# Patient Record
Sex: Female | Born: 1935 | Race: Black or African American | Hispanic: No | Smoking: Former smoker
Health system: Southern US, Community
[De-identification: ages and names within clinical notes are randomized; demographics above are authoritative.]

## PROBLEM LIST (undated history)

## (undated) DIAGNOSIS — F0390 Unspecified dementia without behavioral disturbance: Secondary | ICD-10-CM

## (undated) DIAGNOSIS — K219 Gastro-esophageal reflux disease without esophagitis: Secondary | ICD-10-CM

## (undated) DIAGNOSIS — I517 Cardiomegaly: Secondary | ICD-10-CM

## (undated) DIAGNOSIS — I10 Essential (primary) hypertension: Secondary | ICD-10-CM

## (undated) HISTORY — DX: Cardiomegaly: I51.7

## (undated) HISTORY — DX: Unspecified dementia, unspecified severity, without behavioral disturbance, psychotic disturbance, mood disturbance, and anxiety: F03.90

## (undated) HISTORY — DX: Essential (primary) hypertension: I10

## (undated) HISTORY — DX: Gastro-esophageal reflux disease without esophagitis: K21.9

---

## 1997-11-08 ENCOUNTER — Ambulatory Visit (HOSPITAL_COMMUNITY): Admission: RE | Admit: 1997-11-08 | Discharge: 1997-11-08 | Payer: Self-pay | Admitting: Internal Medicine

## 1997-11-27 ENCOUNTER — Other Ambulatory Visit: Admission: RE | Admit: 1997-11-27 | Discharge: 1997-11-27 | Payer: Self-pay | Admitting: Internal Medicine

## 1998-03-11 ENCOUNTER — Encounter: Payer: Self-pay | Admitting: Emergency Medicine

## 1998-03-11 ENCOUNTER — Emergency Department (HOSPITAL_COMMUNITY): Admission: EM | Admit: 1998-03-11 | Discharge: 1998-03-11 | Payer: Self-pay | Admitting: Emergency Medicine

## 1998-03-22 ENCOUNTER — Encounter: Payer: Self-pay | Admitting: Emergency Medicine

## 1998-03-22 ENCOUNTER — Emergency Department (HOSPITAL_COMMUNITY): Admission: EM | Admit: 1998-03-22 | Discharge: 1998-03-22 | Payer: Self-pay | Admitting: Emergency Medicine

## 1998-05-02 ENCOUNTER — Ambulatory Visit (HOSPITAL_COMMUNITY): Admission: RE | Admit: 1998-05-02 | Discharge: 1998-05-02 | Payer: Self-pay | Admitting: Obstetrics & Gynecology

## 1998-05-24 ENCOUNTER — Encounter: Payer: Self-pay | Admitting: Emergency Medicine

## 1998-05-24 ENCOUNTER — Encounter: Payer: Self-pay | Admitting: Interventional Cardiology

## 1998-05-24 ENCOUNTER — Inpatient Hospital Stay (HOSPITAL_COMMUNITY): Admission: EM | Admit: 1998-05-24 | Discharge: 1998-05-24 | Payer: Self-pay | Admitting: Ophthalmology

## 1998-11-11 ENCOUNTER — Ambulatory Visit (HOSPITAL_COMMUNITY): Admission: RE | Admit: 1998-11-11 | Discharge: 1998-11-11 | Payer: Self-pay | Admitting: Internal Medicine

## 1998-12-24 ENCOUNTER — Other Ambulatory Visit: Admission: RE | Admit: 1998-12-24 | Discharge: 1998-12-24 | Payer: Self-pay | Admitting: Obstetrics and Gynecology

## 1999-04-09 ENCOUNTER — Emergency Department (HOSPITAL_COMMUNITY): Admission: EM | Admit: 1999-04-09 | Discharge: 1999-04-09 | Payer: Self-pay | Admitting: Podiatry

## 1999-04-09 ENCOUNTER — Encounter: Payer: Self-pay | Admitting: *Deleted

## 1999-07-26 ENCOUNTER — Emergency Department (HOSPITAL_COMMUNITY): Admission: EM | Admit: 1999-07-26 | Discharge: 1999-07-26 | Payer: Self-pay | Admitting: Emergency Medicine

## 1999-10-17 ENCOUNTER — Inpatient Hospital Stay (HOSPITAL_COMMUNITY): Admission: EM | Admit: 1999-10-17 | Discharge: 1999-10-17 | Payer: Self-pay | Admitting: Emergency Medicine

## 1999-10-17 ENCOUNTER — Encounter: Payer: Self-pay | Admitting: Emergency Medicine

## 1999-11-12 ENCOUNTER — Ambulatory Visit (HOSPITAL_COMMUNITY): Admission: RE | Admit: 1999-11-12 | Discharge: 1999-11-12 | Payer: Self-pay | Admitting: Interventional Cardiology

## 1999-11-14 ENCOUNTER — Encounter: Payer: Self-pay | Admitting: Internal Medicine

## 1999-11-14 ENCOUNTER — Ambulatory Visit (HOSPITAL_COMMUNITY): Admission: RE | Admit: 1999-11-14 | Discharge: 1999-11-14 | Payer: Self-pay | Admitting: Internal Medicine

## 2000-05-25 ENCOUNTER — Ambulatory Visit (HOSPITAL_COMMUNITY): Admission: RE | Admit: 2000-05-25 | Discharge: 2000-05-25 | Payer: Self-pay | Admitting: Internal Medicine

## 2000-05-25 ENCOUNTER — Encounter: Payer: Self-pay | Admitting: Internal Medicine

## 2000-11-15 ENCOUNTER — Ambulatory Visit (HOSPITAL_COMMUNITY): Admission: RE | Admit: 2000-11-15 | Discharge: 2000-11-15 | Payer: Self-pay | Admitting: Internal Medicine

## 2001-08-23 ENCOUNTER — Encounter: Payer: Self-pay | Admitting: Emergency Medicine

## 2001-08-23 ENCOUNTER — Emergency Department (HOSPITAL_COMMUNITY): Admission: EM | Admit: 2001-08-23 | Discharge: 2001-08-23 | Payer: Self-pay | Admitting: Emergency Medicine

## 2001-11-17 ENCOUNTER — Encounter: Payer: Self-pay | Admitting: Internal Medicine

## 2001-11-17 ENCOUNTER — Ambulatory Visit (HOSPITAL_COMMUNITY): Admission: RE | Admit: 2001-11-17 | Discharge: 2001-11-17 | Payer: Self-pay | Admitting: Internal Medicine

## 2002-02-07 ENCOUNTER — Emergency Department (HOSPITAL_COMMUNITY): Admission: EM | Admit: 2002-02-07 | Discharge: 2002-02-07 | Payer: Self-pay | Admitting: Emergency Medicine

## 2002-02-07 ENCOUNTER — Encounter: Payer: Self-pay | Admitting: Emergency Medicine

## 2002-04-28 ENCOUNTER — Emergency Department (HOSPITAL_COMMUNITY): Admission: EM | Admit: 2002-04-28 | Discharge: 2002-04-29 | Payer: Self-pay | Admitting: Emergency Medicine

## 2002-04-29 ENCOUNTER — Encounter: Payer: Self-pay | Admitting: Emergency Medicine

## 2002-11-20 ENCOUNTER — Ambulatory Visit (HOSPITAL_COMMUNITY): Admission: RE | Admit: 2002-11-20 | Discharge: 2002-11-20 | Payer: Self-pay | Admitting: Internal Medicine

## 2002-11-20 ENCOUNTER — Encounter: Payer: Self-pay | Admitting: Internal Medicine

## 2003-07-17 ENCOUNTER — Emergency Department (HOSPITAL_COMMUNITY): Admission: EM | Admit: 2003-07-17 | Discharge: 2003-07-17 | Payer: Self-pay | Admitting: Emergency Medicine

## 2003-11-26 ENCOUNTER — Ambulatory Visit (HOSPITAL_COMMUNITY): Admission: RE | Admit: 2003-11-26 | Discharge: 2003-11-26 | Payer: Self-pay | Admitting: Internal Medicine

## 2004-04-09 ENCOUNTER — Ambulatory Visit: Payer: Self-pay | Admitting: Pulmonary Disease

## 2005-01-13 ENCOUNTER — Ambulatory Visit (HOSPITAL_COMMUNITY): Admission: RE | Admit: 2005-01-13 | Discharge: 2005-01-13 | Payer: Self-pay | Admitting: Internal Medicine

## 2005-08-10 ENCOUNTER — Ambulatory Visit: Payer: Self-pay | Admitting: Pulmonary Disease

## 2006-01-25 ENCOUNTER — Ambulatory Visit (HOSPITAL_COMMUNITY): Admission: RE | Admit: 2006-01-25 | Discharge: 2006-01-25 | Payer: Self-pay | Admitting: Internal Medicine

## 2006-02-03 ENCOUNTER — Encounter: Admission: RE | Admit: 2006-02-03 | Discharge: 2006-02-03 | Payer: Self-pay | Admitting: Internal Medicine

## 2006-03-22 ENCOUNTER — Emergency Department (HOSPITAL_COMMUNITY): Admission: EM | Admit: 2006-03-22 | Discharge: 2006-03-22 | Payer: Self-pay | Admitting: Emergency Medicine

## 2006-06-10 ENCOUNTER — Ambulatory Visit (HOSPITAL_COMMUNITY): Admission: RE | Admit: 2006-06-10 | Discharge: 2006-06-11 | Payer: Self-pay | Admitting: Orthopedic Surgery

## 2006-08-17 ENCOUNTER — Encounter (HOSPITAL_BASED_OUTPATIENT_CLINIC_OR_DEPARTMENT_OTHER): Admission: RE | Admit: 2006-08-17 | Discharge: 2006-08-25 | Payer: Self-pay | Admitting: Surgery

## 2006-09-15 ENCOUNTER — Ambulatory Visit: Payer: Self-pay | Admitting: Pulmonary Disease

## 2007-02-08 ENCOUNTER — Ambulatory Visit (HOSPITAL_COMMUNITY): Admission: RE | Admit: 2007-02-08 | Discharge: 2007-02-08 | Payer: Self-pay | Admitting: Internal Medicine

## 2008-02-09 ENCOUNTER — Ambulatory Visit (HOSPITAL_COMMUNITY): Admission: RE | Admit: 2008-02-09 | Discharge: 2008-02-09 | Payer: Self-pay | Admitting: Internal Medicine

## 2009-02-11 ENCOUNTER — Ambulatory Visit (HOSPITAL_COMMUNITY): Admission: RE | Admit: 2009-02-11 | Discharge: 2009-02-11 | Payer: Self-pay | Admitting: Internal Medicine

## 2010-02-13 ENCOUNTER — Ambulatory Visit (HOSPITAL_COMMUNITY)
Admission: RE | Admit: 2010-02-13 | Discharge: 2010-02-13 | Payer: Self-pay | Source: Home / Self Care | Attending: Internal Medicine | Admitting: Internal Medicine

## 2010-07-08 NOTE — Assessment & Plan Note (Signed)
[facility]   NAME:  Elizabeth Serrano, Elizabeth Serrano                 ACCOUNT NO.:  [id number]   MEDICAL RECORD NO.:  [id number]      DATE OF BIRTH:  Oct 11, 1920   PHYSICIAN:  Theresia Majors. Tanda Rockers, M.D. VISIT DATE:  08/17/2006                                   OFFICE VISIT   SUBJECTIVE:  Elizabeth Serrano is an 77 year old female referred by Dr. Doristine Section for evaluation of bilateral stasis with ulceration.   IMPRESSION:  Stasis with excoriation.  No active ulceration.   RECOMMENDATIONS:  Proceed with management per protocol for chronic  venous insufficiency with stasis.  (I.e. external compression hose.)  Serial exams to avoid complications (i.e. ulceration hyperpigmentation  and pruritus).   SUBJECTIVE:  Elizabeth Serrano is an 77 year old lady who is in good health.  She had a hip fracture several years ago, and in the prodrome complained  of persistent pain necessitating removal of her internal fixating rod.  She has continued to be ambulatory since she experienced significant  relief of her symptoms from the hardware removal.  As her activity has  increased.  She has noted increased swelling and discoloration in the  lower extremities.  She has had no pain.  She is only complaining of  itching.  These symptoms are more predominant in the evening.  She has  recently procured and is wearing external support hose.   PAST MEDICAL HISTORY:  Remarkable for stable health.   PRIMARY CARE PHYSICIAN:  Dr. Margaretmary Bayley.  Dermatologist, Dr. Joseph Art.   CURRENT MEDICATION LIST:  Includes triamcinolone ointment, Aredia, an  aspirin, and Hyzaar.   PREVIOUS SURGERIES:  Includes the cholecystectomy in 1979, open  reduction internal fixation rodding of the right femur in 1994 with  removal of a rod in April 2008.   She is allergic to PENICILLIN.   FAMILY HISTORY:  Positive for diabetes and hypertension and stroke.   SOCIALLY:  She is married.  She lives in [place] with her husband.  She is a  retired Programmer, systems.  She has 1 son who lives remotely.   REVIEW OF SYSTEMS:  Remarkable for extraordinary independence.  The  patient does her own shopping, housework and house domestic tasks.  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  She denies  visual changes, transient paralysis, or speech impediments.  She  specifically denies angina symptomatology.  She has had an episode of  angina in the past but has had no recurrent symptoms.  Her bowel and  bladder function are described as normal.  Her weight is stable.  The  remainder of the review of systems is negative.   PHYSICAL EXAM:  She is a pleasant elderly female in good contact with  reality.  Spontaneous in conversation and responding appropriately to  all of historical inquiries.  The blood pressure is 150/81, respirations 18, pulse rate 77,  temperature 98.1.  The HEENT exam is clear.  Neck is supple.  Trachea is midline.  Thyroid is nonpalpable.  Carotid  bruits are not discerned.  The lungs are clear.  Heart sounds were distant.  Abdomen is soft.  Femoral pulses are 3+.  The dorsalis pedis pulse bilaterally is 2+.  Capillary refill was  normal.  There are chronic  changes of stasis with hyperpigmentation and  there are minor areas of excoriation from scratching on the anterior  shins of the right lower extremity.  The sensation is intact.   ASSESSMENT:  Chronic venous insufficiency with mild to moderate stasis.   DISCUSSION:  This 77 year old lady has quite remarkable and stable  health.  We have explained the pathophysiology of stasis in terms that  she seems to understand.  We have explained the nature and the use of  external compression in the management of the same.  We have given her  an opportunity to ask questions.  She seems to comprehend all of the  aforementioned discussion, expresses gratitude for having been seen in  the clinic.  She will continue her triamcinolone ointment and the use of   external compression hose.  We will be happy to reevaluate her in the  event that she develops an ulcer or a complication of stasis.  These  again have been addressed specifically and discussed with the patient.      Harold A. Tanda Rockers, M.D.  Electronically Signed     HAN/MEDQ  D:  08/17/2006  T:  08/17/2006  Job:  [id number]   cc:   Margaretmary Bayley, M.D.  Karie Soda. Joseph Art, M.D.

## 2010-07-11 NOTE — Op Note (Signed)
NAME:  Elizabeth Serrano, Elizabeth Serrano                 ACCOUNT NO.:  [id number]   MEDICAL RECORD NO.:  [id number]          PATIENT TYPE:  OIB   LOCATION:  5041                         FACILITY:  [facility]   PHYSICIAN:  Lenard Galloway. Mortenson, M.D.DATE OF BIRTH:  December 12, 1920   DATE OF PROCEDURE:  06/10/2006  DATE OF DISCHARGE:                               OPERATIVE REPORT   JUSTIFICATION:  An 77 year old female who has IM nail in the right femur  that has become painful proximally.  Has a large area of myositis  ossificans and tenderness proximally.  She is now admitted for removal  of IM nail.   JUSTIFICATION OUTPATIENT SURGERY:  Minimal morbidity.   PREOPERATIVE DIAGNOSIS:  Routine hardware right femur, painful.   POSTOPERATIVE DIAGNOSIS:  Routine hardware right femur, painful.   OPERATION:  Removal intramedullary nail, right femur.   SURGEON:  Lenard Galloway. Chaney Malling, M.D.   ASSISTANT:  Arlys John D. Petrarca, P.A.-C.   ANESTHESIA:  General.   PROCEDURE:  After the satisfactory general anesthesia, the patient was  placed on the operative table in full lateral position, the right hip  up.  The right leg and hip were then prepped with DuraPrep and draped  out in the usual manner.  C-arm imaging was used throughout.  An  incision was made distally where the previous interlocking screw was  inserted.  This was all done under C-arm control.  Dissection carried  down to the lateral side of the distal femur.  The interlocking screw  was found and backed out.  Attention was then turned to the proximal  femur.  An incision was made above the greater trochanter.  Dissection  was carried down distally.  C-arm was used for localization with the  possible end of the femur.  A large area of myositis ossificans about  the greater trochanter was identified and isolated.  Soft tissue was  stripped off.  An osteotome was used to break up the myositis and then  the IM nail could slightly be seen.  Myositis was cleared out so  there  was excellent access to the proximal nail.  I tapped the instruments  passed down the femoral IM nail proximally and then a slap hammer was  attached.  Using the slap hammer, the IM nail was then backed out of the  femur very easily with no complications.  The wound was irrigated.  The  fascia was closed with Vicryl, subcutaneous tissue closed with Vicryl,  and stainless steel staples used to close the skin.  Sterile dressing  was placed about the hip and distal femur and the patient returned to  recovery room in excellent condition.  Drains:  None.  Complications:  None.           ______________________________  Lenard Galloway. Chaney Malling, M.D.     RAM/MEDQ  D:  06/10/2006  T:  06/10/2006  Job:  [id number]

## 2010-07-11 NOTE — H&P (Signed)
[facility]  Patient:    HOA, BRIGGS                          MRN: [id number] Adm. Date:  [id number] Attending:  Lyn Records. Iii CC:         Darci Needle, M.D.  Lindell Spar. Chestine Spore, M.D.   History and Physical  REASON FOR ADMISSION:  Chest discomfort.  HISTORY OF PRESENT ILLNESS:  The patient is a 77 year old black female with a prior diagnosis of angina and hyperlipidemia.  She has chronic abdominal complaints.  She has seen Dr. Randa Evens before.  She reportedly had a catheterization in previous years according to old chart in 1996. It reportedly did not show significant disease.  Records are not available at that time either.  She was admitted with chest wall pain in 1997. She presented to the emergency room around 12 a.m. this morning with chest discomfort as well as recent lower leg symptoms.  She describes increasing cramping and lower leg pain that she had attributed to Zocor.  Her family doctor, Dr. Margaretmary Bayley, had taken her off of Zocor and switched her to Lipitor but she has now stopped taking that because of what she describes as leg pain.  She then developed mid sternal burning type pain which became sharp and mildly pleuritic.  She presented to the emergency room around midnight and had a borderline CPK and MB.  The symptoms had variable response to nitroglycerin and would wax and wane.  She also would complain of a left sided burning type pain which was different than the mid pain. It was not worsened with food.  She is currently not having any symptoms and was admitted to rule out an myocardial infarction.  PAST MEDICAL HISTORY:  Remarkable for hyperlipidemia and chronic abdominal problems.  Negative for hypertension or diabetes.  PAST SURGICAL HISTORY:  Cholecystectomy and cataract surgery.  ALLERGIES:  Penicillin and Claritin, also several environmental substances.  CURRENT MEDICINES: Cardizem 120 mg daily. Vitamin E.   Baby aspirin.  Tussinbin 1/2 tablet b.i.d.  Multiple eye drops and allergy shots weekly.  FAMILY HISTORY:  Negative for premature cataract disease.  Father died at age 77.  SOCIAL HISTORY:  She is married and lives with her husband. She drinks rare alcohol and has one child.  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  REVIEW OF SYSTEMS:  Some constipation.  She has some abdominal pain.  She has no GU symptoms and no weight loss.  The remainder of the review of systems is unremarkable except as noted above.  PHYSICAL EXAMINATION:  GENERAL: She is a pleasant black female appearing younger than stated age.  VITAL SIGNS:  Blood pressure 130/80, pulse 70.  SKIN: Warm and dry.  HEENT:  Pupils constricted.  No xanthelasma.  Pharynx negative.  NECK:  Supple without masses, thyromegaly or carotid bruits.  LYMPH NODES:  Normal.  LUNGS:  Clear to A&P.  CARDIAC:  Normal S1 and S2.  No S3, S4 or murmur.  ABDOMEN:  Soft and nontender.  No rebound.  No aneurysm noted.  PULSES: Femoral pulses are 2+. Pedal pulses are 2+. There is trace edema.  NEUROLOGIC:  Normal.  12 lead ECG showed anterolateral T wave inversion similar to previous studies.  IMPRESSION: 1. Chest discomfort with some typical or other atypical features, rule out    unstable angina or myocardial  infarction. 2. Hyperlipidemia not currently under treatment. 3. History of diagnosis of angina with reported negative catheterization    previously but not documented in records. 4. Multiple allergies. 5. Glaucoma under treatment.  RECOMMENDATIONS:  Admit and begin IV heparin.  Rule out MI with serial CPKs and EKGs.  Further follow up and workup by Dr. Katrinka Blazing. DD:  10/17/99 TD:  10/17/99 Job: [id number]

## 2011-02-25 ENCOUNTER — Other Ambulatory Visit: Payer: Self-pay | Admitting: Internal Medicine

## 2011-02-25 DIAGNOSIS — Z1231 Encounter for screening mammogram for malignant neoplasm of breast: Secondary | ICD-10-CM

## 2011-03-24 ENCOUNTER — Ambulatory Visit (HOSPITAL_COMMUNITY)
Admission: RE | Admit: 2011-03-24 | Discharge: 2011-03-24 | Disposition: A | Discharge status: Home / Self Care | Payer: Medicare Other | Source: Ambulatory Visit | Attending: Internal Medicine | Admitting: Internal Medicine

## 2011-03-24 ENCOUNTER — Ambulatory Visit (HOSPITAL_COMMUNITY): Payer: Self-pay

## 2011-03-24 DIAGNOSIS — Z1231 Encounter for screening mammogram for malignant neoplasm of breast: Secondary | ICD-10-CM | POA: Insufficient documentation

## 2011-04-21 ENCOUNTER — Ambulatory Visit (HOSPITAL_COMMUNITY): Payer: Self-pay

## 2013-09-13 ENCOUNTER — Encounter: Payer: Self-pay | Admitting: *Deleted

## 2015-02-06 ENCOUNTER — Ambulatory Visit (INDEPENDENT_AMBULATORY_CARE_PROVIDER_SITE_OTHER): Payer: Medicare Other | Admitting: Internal Medicine

## 2015-02-06 ENCOUNTER — Other Ambulatory Visit (INDEPENDENT_AMBULATORY_CARE_PROVIDER_SITE_OTHER): Payer: Medicare Other

## 2015-02-06 ENCOUNTER — Encounter: Payer: Self-pay | Admitting: Internal Medicine

## 2015-02-06 VITALS — BP 126/88 | HR 67 | Temp 98.3°F | Resp 20 | Ht 59.0 in | Wt 113.2 lb

## 2015-02-06 DIAGNOSIS — Z23 Encounter for immunization: Secondary | ICD-10-CM | POA: Diagnosis not present

## 2015-02-06 DIAGNOSIS — K219 Gastro-esophageal reflux disease without esophagitis: Secondary | ICD-10-CM | POA: Diagnosis not present

## 2015-02-06 DIAGNOSIS — I10 Essential (primary) hypertension: Secondary | ICD-10-CM | POA: Diagnosis not present

## 2015-02-06 DIAGNOSIS — R413 Other amnesia: Secondary | ICD-10-CM | POA: Diagnosis not present

## 2015-02-06 LAB — CBC
HEMATOCRIT: 36.2 % (ref 36.0–46.0)
Hemoglobin: 11.7 g/dL — ABNORMAL LOW (ref 12.0–15.0)
MCHC: 32.3 g/dL (ref 30.0–36.0)
MCV: 85.5 fl (ref 78.0–100.0)
Platelets: 234 10*3/uL (ref 150.0–400.0)
RBC: 4.24 Mil/uL (ref 3.87–5.11)
RDW: 14.3 % (ref 11.5–15.5)
WBC: 5.7 10*3/uL (ref 4.0–10.5)

## 2015-02-06 LAB — COMPREHENSIVE METABOLIC PANEL
ALBUMIN: 3.7 g/dL (ref 3.5–5.2)
ALT: 10 U/L (ref 0–35)
AST: 20 U/L (ref 0–37)
Alkaline Phosphatase: 73 U/L (ref 39–117)
BUN: 15 mg/dL (ref 6–23)
CALCIUM: 9.1 mg/dL (ref 8.4–10.5)
CHLORIDE: 100 meq/L (ref 96–112)
CO2: 28 mEq/L (ref 19–32)
Creatinine, Ser: 1.18 mg/dL (ref 0.40–1.20)
GFR: 55.01 mL/min — AB (ref >60.00)
Glucose, Bld: 93 mg/dL (ref 70–99)
POTASSIUM: 4.3 meq/L (ref 3.5–5.1)
SODIUM: 135 meq/L (ref 135–145)
Total Bilirubin: 0.4 mg/dL (ref 0.2–1.2)
Total Protein: 6.7 g/dL (ref 6.0–8.3)

## 2015-02-06 LAB — LIPID PANEL
CHOLESTEROL: 219 mg/dL — AB (ref 0–200)
HDL: 64.2 mg/dL (ref >39.00)
LDL CALC: 138 mg/dL — AB (ref 0–99)
NonHDL: 154.42
TRIGLYCERIDES: 84 mg/dL (ref 0.0–149.0)
Total CHOL/HDL Ratio: 3
VLDL: 16.8 mg/dL (ref 0.0–40.0)

## 2015-02-06 MED ORDER — DONEPEZIL HCL 10 MG PO TABS: 10.0000 mg | ORAL_TABLET | Freq: Every day | ORAL | Status: DC

## 2015-02-06 MED ORDER — OLMESARTAN MEDOXOMIL-HCTZ 20-12.5 MG PO TABS: 1.0000 | ORAL_TABLET | Freq: Every day | ORAL | Status: DC

## 2015-02-06 NOTE — Patient Instructions (Signed)
We will check your labs today and call you back about the results.   We have sent in the refills of the benicar. We have also sent in the memory medicine that is proven to work and help keep the memory good.   Memory Compensation Strategies  1. Use "WARM" strategy.  W= write it down  A= associate it  R= repeat it  M= make a mental note  2.   You can keep a Glass blower/designerMemory Notebook.  Use a 3-ring notebook with sections for the following: calendar, important names and phone numbers,  medications, doctors' names/phone numbers, lists/reminders, and a section to journal what you did  each day.   3.    Use a calendar to write appointments down.  4.    Write yourself a schedule for the day.  This can be placed on the calendar or in a separate section of the Memory Notebook.  Keeping a  regular schedule can help memory.  5.    Use medication organizer with sections for each day or morning/evening pills.  You may need help loading it  6.    Keep a basket, or pegboard by the door.  Place items that you need to take out with you in the basket or on the pegboard.  You may also want to  include a message board for reminders.  7.    Use sticky notes.  Place sticky notes with reminders in a place where the task is performed.  For example: " turn off the  stove" placed by the stove, "lock the door" placed on the door at eye level, " take your medications" on  the bathroom mirror or by the place where you normally take your medications.  8.    Use alarms/timers.  Use while cooking to remind yourself to check on food or as a reminder to take your medicine, or as a  reminder to make a call, or as a reminder to perform another task, etc.

## 2015-02-06 NOTE — Progress Notes (Signed)
Pre visit review using our clinic review tool, if applicable. No additional management support is needed unless otherwise documented below in the visit note. 

## 2015-02-07 DIAGNOSIS — K219 Gastro-esophageal reflux disease without esophagitis: Secondary | ICD-10-CM | POA: Insufficient documentation

## 2015-02-07 DIAGNOSIS — I10 Essential (primary) hypertension: Secondary | ICD-10-CM | POA: Insufficient documentation

## 2015-02-07 DIAGNOSIS — F0281 Dementia in other diseases classified elsewhere with behavioral disturbance: Secondary | ICD-10-CM | POA: Insufficient documentation

## 2015-02-07 DIAGNOSIS — F22 Delusional disorders: Secondary | ICD-10-CM

## 2015-02-07 DIAGNOSIS — G301 Alzheimer's disease with late onset: Secondary | ICD-10-CM
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
## 2015-02-07 NOTE — Assessment & Plan Note (Signed)
Taking prevacid with good relief. Continue for now.

## 2015-02-07 NOTE — Assessment & Plan Note (Signed)
BP controlled on benicar/hctz and will continue. Checking CMP today and adjust as needed.

## 2015-02-07 NOTE — Assessment & Plan Note (Signed)
Some impairment on exam today. She did not want to do formal assessment today with MMSE as she does not feel that it is a problem. Advised that she drive only within 5 miles of home. No traffic violations or accidents. Rx for aricept given today for her to take. She was likely on exelon patches in the past which did not do well since she did not know she should continue and did not remember. She does take her own medicines and will take this for her memory. She is also taking prevagen OTC and I advised her that this may not have any efficacy (no good studies).

## 2015-04-22 ENCOUNTER — Ambulatory Visit (INDEPENDENT_AMBULATORY_CARE_PROVIDER_SITE_OTHER): Payer: Medicare Other | Admitting: Internal Medicine

## 2015-04-22 ENCOUNTER — Encounter: Payer: Self-pay | Admitting: Internal Medicine

## 2015-04-22 VITALS — BP 102/70 | HR 81 | Temp 98.1°F | Resp 16 | Ht 59.0 in | Wt 109.0 lb

## 2015-04-22 DIAGNOSIS — R413 Other amnesia: Secondary | ICD-10-CM | POA: Diagnosis not present

## 2015-04-22 MED ORDER — DONEPEZIL HCL 10 MG PO TABS: 10.0000 mg | ORAL_TABLET | Freq: Every day | ORAL | Status: DC

## 2015-04-22 NOTE — Patient Instructions (Signed)
We have given you the letter for not driving.   We are happy to fill out any forms for her new living situation and it is my opinion that for her safety she needs to be moved as soon as possible to a safe living situation.   Please call us if we can do anything to help.

## 2015-04-22 NOTE — Progress Notes (Signed)
Pre visit review using our clinic review tool, if applicable. No additional management support is needed unless otherwise documented below in the visit note. 

## 2015-04-24 ENCOUNTER — Telehealth: Payer: Self-pay | Admitting: *Deleted

## 2015-04-24 NOTE — Telephone Encounter (Signed)
Received call from pt daughter Hart Carwin) stating that they spoke with md about mom crying spells & hallucinations at night they would like md to go ahead to rx something that she can take to help her calm down & get rest at night...Raechel Chute
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
## 2015-04-25 NOTE — Telephone Encounter (Signed)
Pls advise on msg below.../lmb 

## 2015-04-25 NOTE — Assessment & Plan Note (Signed)
She has not tried aricept. Given her my recommendation that she should not drive for her safety and the safety of others. She is in agreement of that. Also convinced her that her safety is in question in her current living situation and will fill out FL-2 when they decide on living situation.

## 2015-04-26 MED ORDER — QUETIAPINE FUMARATE 100 MG PO TABS: 100.0000 mg | ORAL_TABLET | Freq: Every evening | ORAL | Status: DC | PRN

## 2015-04-26 NOTE — Telephone Encounter (Signed)
Sent in seroquel that can be used at bedtime prn.

## 2015-04-26 NOTE — Telephone Encounter (Signed)
Received call from pt daughter Hart Carwin([place]) requesting status on msg she left Wed. Pls advise...Raechel Chute/lmb

## 2015-04-26 NOTE — Telephone Encounter (Signed)
Notified pt/daughter w/MD response../lmb 

## 2015-05-03 ENCOUNTER — Telehealth: Payer: Self-pay | Admitting: Internal Medicine

## 2015-05-03 DIAGNOSIS — R300 Dysuria: Secondary | ICD-10-CM
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
## 2015-05-03 NOTE — Telephone Encounter (Signed)
This would have to be dealt with at a visit since we did not discuss at all at the recent visit. If the problem is escalating it would be a good idea to take her to the ER so they can make sure she does not have any infections which could cause the worsening.

## 2015-05-06 ENCOUNTER — Other Ambulatory Visit (INDEPENDENT_AMBULATORY_CARE_PROVIDER_SITE_OTHER): Payer: Medicare Other

## 2015-05-06 DIAGNOSIS — R300 Dysuria: Secondary | ICD-10-CM | POA: Diagnosis not present

## 2015-05-06 LAB — URINALYSIS, ROUTINE W REFLEX MICROSCOPIC
BILIRUBIN URINE: NEGATIVE
Hgb urine dipstick: NEGATIVE
Ketones, ur: NEGATIVE
LEUKOCYTES UA: NEGATIVE
Nitrite: NEGATIVE
PH: 6 (ref 5.0–8.0)
RBC / HPF: NONE SEEN (ref 0–?)
SPECIFIC GRAVITY, URINE: 1.015 (ref 1.000–1.030)
Total Protein, Urine: NEGATIVE
Urine Glucose: NEGATIVE
Urobilinogen, UA: 0.2 (ref 0.0–1.0)
WBC, UA: NONE SEEN (ref 0–?)

## 2015-05-06 NOTE — Telephone Encounter (Signed)
Called left vm with ivy

## 2015-05-06 NOTE — Telephone Encounter (Signed)
Patient will come to the lab and leave a urine sample and she will also drop off the FL2 forms.

## 2015-05-06 NOTE — Addendum Note (Signed)
Addended by: Hillard DankerRAWFORD, ELIZABETH A on: 05/06/2015 11:23 AM   Modules accepted: Orders

## 2015-05-06 NOTE — Telephone Encounter (Signed)
See encounter below.  

## 2015-05-06 NOTE — Telephone Encounter (Signed)
Pt has called back and they are also needing an appointment to have a FL2 and DMV form filled out along with treating a bladder infection asap and there are no openings this week. Would you be willing to see her before you leave Wednesday morning?

## 2015-05-06 NOTE — Telephone Encounter (Signed)
Urine placed for lab they can come do. They do not need visit for forms as we talked about those at last visit. They can just drop off forms and will be done ASAP.

## 2015-05-08 ENCOUNTER — Ambulatory Visit (INDEPENDENT_AMBULATORY_CARE_PROVIDER_SITE_OTHER): Payer: Medicare Other | Admitting: Nurse Practitioner

## 2015-05-08 ENCOUNTER — Encounter: Payer: Self-pay | Admitting: Nurse Practitioner

## 2015-05-08 ENCOUNTER — Telehealth: Payer: Self-pay

## 2015-05-08 VITALS — BP 116/60 | HR 70 | Temp 97.6°F | Ht 59.0 in | Wt 110.5 lb

## 2015-05-08 DIAGNOSIS — R32 Unspecified urinary incontinence: Secondary | ICD-10-CM

## 2015-05-08 DIAGNOSIS — R2681 Unsteadiness on feet: Secondary | ICD-10-CM

## 2015-05-08 NOTE — Telephone Encounter (Signed)
Pt was seen today by Naomie Deanarrie Doss. Pt son states that the Aricept has helped a lot with her memory but she is still having some aggression starting after about 1-2 in the afternoon.Is there an adjustment to the seroquel or would an OV be needed to further evaluate.   Also, pt son is requesting an rx for a new walking cane. The cane that she is using is bent and not very sturdy.

## 2015-05-08 NOTE — Progress Notes (Signed)
Patient ID: Elizabeth BobLois C Neth, female    DOB: 06-22-1920  Age: 82 y.o. MRN: [id number]  CC: Possible Blood loss   HPI Elizabeth Serrano presents for CC of blood in the toilet from unknown location. Daughter saw the blood.   1) Pt had UA on 05/06/15 without any findings of hematuria or infection.  1 episode in the toilet- not frank blood Dribbling urine constantly she reports  Obvious discoloration down front and sides of pant legs    History Elizabeth Serrano has a past medical history of Hypertension; LVH (left ventricular hypertrophy); GERD (gastroesophageal reflux disease); and Dementia.   She has no past surgical history on file.   Her family history is not on file.She reports that she quit smoking about 38 years ago. She does not have any smokeless tobacco history on file. Her alcohol and drug histories are not on file.  Outpatient Prescriptions Prior to Visit  Medication Sig Dispense Refill  . aspirin 81 MG tablet Take 81 mg by mouth daily.    . bimatoprost (LUMIGAN) 0.01 % SOLN Place 1 drop into both eyes at bedtime.    . brimonidine (ALPHAGAN P) 0.1 % SOLN     . donepezil (ARICEPT) 10 MG tablet Take 1 tablet (10 mg total) by mouth at bedtime. 90 tablet 3  . dorzolamide-timolol (COSOPT) 22.3-6.8 MG/ML ophthalmic solution 1 drop 2 (two) times daily.    . lansoprazole (PREVACID) 15 MG capsule Take 15 mg by mouth as needed.    Marland Kitchen. olmesartan-hydrochlorothiazide (BENICAR HCT) 20-12.5 MG tablet Take 1 tablet by mouth daily. 90 tablet 3  . QUEtiapine (SEROQUEL) 100 MG tablet Take 1 tablet (100 mg total) by mouth at bedtime as needed. 30 tablet 0  . vitamin E 400 UNIT capsule Take 400 Units by mouth daily.     No facility-administered medications prior to visit.    ROS Review of Systems  Constitutional: Negative for fever, chills, diaphoresis and fatigue.  Genitourinary: Positive for vaginal bleeding. Negative for dysuria, urgency, frequency, hematuria, flank pain, decreased urine volume, difficulty  urinating and pelvic pain.  Skin: Negative for rash.   Objective:  BP 116/60 mmHg  Pulse 70  Temp(Src) 97.6 F (36.4 C) (Oral)  Ht 4\' 11"  (1.499 m)  Wt 110 lb 8 oz (50.122 kg)  BMI 22.31 kg/m2  SpO2 97%  Physical Exam  Constitutional: She appears well-developed and well-nourished. No distress.  HENT:  Head: Normocephalic and atraumatic.  Right Ear: External ear normal.  Left Ear: External ear normal.  Genitourinary: Vagina normal. No vaginal discharge found.  No external or internal signs of bleeding  Rectal area shows no signs of bleeding either- external hemorrhoid non-thrombosed, non-tender  Neurological: She is alert.  Skin: Skin is warm and dry. No rash noted. She is not diaphoretic.   Assessment & Plan:   Elizabeth Serrano was seen today for possible blood loss.  Diagnoses and all orders for this visit:  Urinary incontinence in female -     Ambulatory referral to Urology   I am having Ms. Degroote maintain her lansoprazole, vitamin E, aspirin, dorzolamide-timolol, bimatoprost, brimonidine, olmesartan-hydrochlorothiazide, donepezil, and QUEtiapine.  No orders of the defined types were placed in this encounter.     Follow-up: Return if symptoms worsen or fail to improve.

## 2015-05-08 NOTE — Progress Notes (Signed)
Pre visit review using our clinic review tool, if applicable. No additional management support is needed unless otherwise documented below in the visit note. 

## 2015-05-08 NOTE — Patient Instructions (Signed)
We will get you set up with urology for urinary incontinence.   Everything else looks good!

## 2015-05-09 NOTE — Telephone Encounter (Signed)
Order entered for quadcane per PCP. [facility] for advise regarding any medicare guidelines.

## 2015-05-09 NOTE — Telephone Encounter (Signed)
Left msg on son's vm to call back to schedule OV.

## 2015-05-09 NOTE — Telephone Encounter (Signed)
Did they address this with Lyla SonCarrie? This is really something that needs a visit as we did not talk about at our visit. Do they want just a normal cane or quadcane?

## 2015-05-09 NOTE — Telephone Encounter (Signed)
She currently has a quadcane.   I will get the son to schedule an OV to discuss the med adjustment/change.

## 2015-05-12 DIAGNOSIS — R32 Unspecified urinary incontinence: Secondary | ICD-10-CM | POA: Insufficient documentation

## 2015-05-12 NOTE — Assessment & Plan Note (Addendum)
Obvious dribbling of urine upon observation and pant legs are discolored. Pt is resistant to wearing depends daily. PCP will be placing referral. No obvious signs of bleeding internally/externally from the vuvla/vaginal vault nor from the anus/rectum.

## 2015-05-22 ENCOUNTER — Other Ambulatory Visit: Payer: Self-pay | Admitting: Internal Medicine

## 2015-05-22 ENCOUNTER — Ambulatory Visit (INDEPENDENT_AMBULATORY_CARE_PROVIDER_SITE_OTHER): Payer: Medicare Other | Admitting: Internal Medicine

## 2015-05-22 ENCOUNTER — Encounter: Payer: Self-pay | Admitting: Internal Medicine

## 2015-05-22 VITALS — BP 118/60 | HR 60 | Temp 98.1°F | Resp 12 | Ht 59.0 in | Wt 112.0 lb

## 2015-05-22 DIAGNOSIS — F0281 Dementia in other diseases classified elsewhere with behavioral disturbance: Secondary | ICD-10-CM

## 2015-05-22 DIAGNOSIS — F22 Delusional disorders: Secondary | ICD-10-CM | POA: Diagnosis not present

## 2015-05-22 DIAGNOSIS — G309 Alzheimer's disease, unspecified: Secondary | ICD-10-CM

## 2015-05-22 DIAGNOSIS — G301 Alzheimer's disease with late onset: Secondary | ICD-10-CM

## 2015-05-22 DIAGNOSIS — Z23 Encounter for immunization: Secondary | ICD-10-CM | POA: Diagnosis not present

## 2015-05-22 MED ORDER — LORAZEPAM 1 MG PO TABS: 1.0000 mg | ORAL_TABLET | Freq: Two times a day (BID) | ORAL | Status: DC | PRN

## 2015-05-22 NOTE — Progress Notes (Signed)
   Subjective:    Patient ID: Elizabeth Serrano, female    DOB: 12-Aug-1920, 82 y.o.   MRN: [id number]  HPI The patient is a 82 YO female coming in for her dementia. She is not able to give an accurate history so her son helps out. She is acting hostile towards him and his wife. She is now living with him since she is not safe at her home. They deny that she has had fevers or chills and she denies any complaints. They have tried seroquel for this but it did not help. She is acting aggressively towards them both. They are working on getting her into a memory unit.   Review of Systems  Unable to perform ROS: Dementia  Constitutional: Negative for fever, activity change, appetite change, fatigue and unexpected weight change.  Respiratory: Negative for cough, chest tightness, shortness of breath and wheezing.   Cardiovascular: Negative for chest pain, palpitations and leg swelling.  Gastrointestinal: Negative for nausea, abdominal pain, diarrhea, constipation and abdominal distention.  Neurological:       Memory change      Objective:   Physical Exam  Constitutional: She appears well-developed and well-nourished.  HENT:  Head: Normocephalic and atraumatic.  Eyes: EOM are normal.  Neck: Neck supple.  Cardiovascular: Normal rate and regular rhythm.   Pulmonary/Chest: Effort normal and breath sounds normal. No respiratory distress. She has no wheezes.  Abdominal: Soft. She exhibits no distension. There is no tenderness.  Neurological: She is alert. Coordination normal.  A and O times 1 (not to specific date or place), overall memory poor and telling stories in present tense that are about 50 years or so ago.   Skin: Skin is warm and dry.  Psychiatric:  Slightly paranoid   Filed Vitals:   05/22/15 0946  BP: 118/60  Pulse: 60  Temp: 98.1 F (36.7 C)  TempSrc: Oral  Resp: 12  Height: 4\' 11"  (1.499 m)  Weight: 112 lb (50.803 kg)  SpO2: 90%      Assessment & Plan:  Prevnar 13 given at  visit

## 2015-05-22 NOTE — Assessment & Plan Note (Signed)
She is taking aricept. Stopping seroquel and will try lorazepam for her aggressive behavior towards her son and daughter in law.

## 2015-05-22 NOTE — Progress Notes (Signed)
Pre visit review using our clinic review tool, if applicable. No additional management support is needed unless otherwise documented below in the visit note. 

## 2015-05-22 NOTE — Patient Instructions (Signed)
We have sent in a new medicine to try for when she gets agitated. You need to start calling facilities to see if they have room for her to get her there. Once you pick a place they will have a form that we need to fill out. They can send it to us or you can bring it. We generally get these done quickly so she can have the room she needs and the supervision.   We have given you lorazepam that you can give her when she gets agitated. She can use it up to twice daily.   Alzheimer Disease Caregiver Guide Alzheimer disease is an illness that affects a person's brain. It causes a person to lose the ability to remember things and make good decisions. As the disease progresses, the person is unable to take care of himself or herself and needs more and more help to do simple tasks. Taking care of someone with Alzheimer disease can be very challenging and overwhelming.  MEMORY LOSS AND CONFUSION Memory loss and confusion is mild in the beginning stages of the disease. Both of these problems become more severe as the disease progresses. Eventually, the person will not recognize places or even close family members and friends.   Stay calm.  Respond with a short explanation. Long explanations can be overwhelming and confusing.  Avoid corrections that sound like scolding.  Try not to take it personally, even if the person forgets your name. BEHAVIOR CHANGES Behavior changes are part of the disease. The person may develop depression, anxiety, anger, hallucinations, or other behavior changes. These changes can come on suddenly and may be in response to pain, infection, changes in the environment (temperature, noise), overstimulation, or feeling lost or scared.   Try not to take behavior changes personally.  Remain calm and patient.  Do not argue or try to convince the person about a specific point. This will only make him or her more agitated.  Know that the behavior changes are part of the disease process  and try to work through it. TIPS TO REDUCE FRUSTRATION  Schedule wisely by making appointments and doing daily tasks, like bathing and dressing, when the person is at his or her best.  Take your time. Simple tasks may take a lot longer, so be sure to allow for plenty of time.  Limit choices. Too many choices can be overwhelming and stressful for the person.  Involve the person in what you are doing.  Stick to a routine.  Avoid new or crowded situations, if possible.  Use simple words, short sentences, and a calm voice. Only give one direction at a time.  Buy clothes and shoes that are easy to put on and take off.  Let people help if they offer. HOME SAFETY Keeping the home safe is very important to reduce the risk of falls and injuries.   Keep floors clear of clutter. Remove rugs, magazine racks, and floor lamps.  Keep hallways well lit.  Put a handrail and nonslip mat in the bathtub or shower.  Put childproof locks on cabinets with dangerous items, such as medicine, alcohol, guns, toxic cleaning items, sharp tools or utensils, matches, or lighters.  Place locks on doors where the person cannot easily see or reach them. This helps ensure that the person cannot wander out of the house and get lost.  Be prepared for emergencies. Keep a list of emergency phone numbers and addresses in a convenient area. PLANS FOR THE FUTURE  Do not  put off talking about finances.  Talk about money management. People with Alzheimer disease have trouble managing their money as the disease gets worse.  Get help from professional advisors regarding financial and legal matters.  Do not put off talking about future care.  Choose a power of attorney. This is someone who can make decisions for the person with Alzheimer disease when he or she is no longer able to do so.  Talk about driving and when it is the right time to stop. The person's health care provider can help give advice on this  matter.  Talk about the person's living situation. If he or she lives alone, you need to make sure he or she is safe. Some people need extra help at home, and others need more care at a nursing home or care center. SUPPORT GROUPS Joining a support group can be very helpful for caregivers of people with Alzheimer disease. Some advantages to being part of a support group include:   Getting strategies to manage stress.  Sharing experiences with others.  Receiving emotional comfort and support.  Learning new caregiving skills as the disease progresses.  Knowing what community resources are available and taking advantage of them. SEEK MEDICAL CARE IF:  The person has a fever.  The person has a sudden change in behavior that does not improve with calming strategies.  The person is unable to manage in his or her current living situation.  The person threatens you or anyone else, including himself or herself.  You are no longer able to care for the person.   This information is not intended to replace advice given to you by your health care provider. Make sure you discuss any questions you have with your health care provider.   Document Released: 10/22/2003 Document Revised: 03/02/2014 Document Reviewed: 03/18/2011 Elsevier Interactive Patient Education [employer]

## 2015-05-23 ENCOUNTER — Telehealth: Payer: Self-pay

## 2015-05-23 NOTE — Telephone Encounter (Signed)
Call Marquita PalmsMario (son) (HIPAA identified); States patient was on fluid pill and is incont at present; also somewhat agitated and verbally abusive; Home care plan is not working due to changes in the disease. Has assessment tomorrow for facility placement; States he and Lajoyce Cornersvy who helps him are feeling overwhelmed. Was driving; has apt at 4pm tomorrow to discuss resources on this disease. Father had alz but mother took care of him.  Also states she has fallen x 3 in 24 hours after starting lorazepam; Was told this medicine may need to be stopped but the son stated they have to give her something. The son wants to try .5mg  tablet; Will review with Dr. Okey Duprerawford.  24/7 care ongoing. Discussed need for memory unit which is secure; not sure what the family is considering;   Please advise regarding medication.

## 2015-05-23 NOTE — Telephone Encounter (Signed)
They can split pill in half and use that. They can also use 1/4 of the pill if the 1/2 is too strong. Agree with need for care and FL2 can be signed once facility picked.

## 2015-05-27 ENCOUNTER — Ambulatory Visit (INDEPENDENT_AMBULATORY_CARE_PROVIDER_SITE_OTHER): Payer: Medicare Other | Admitting: Geriatric Medicine

## 2015-05-27 ENCOUNTER — Telehealth: Payer: Self-pay

## 2015-05-27 DIAGNOSIS — Z299 Encounter for prophylactic measures, unspecified: Secondary | ICD-10-CM

## 2015-05-27 DIAGNOSIS — Z418 Encounter for other procedures for purposes other than remedying health state: Secondary | ICD-10-CM

## 2015-05-27 NOTE — Telephone Encounter (Signed)
Assisted Living Admission Forms completed and placed on MD's desk for signature

## 2015-05-29 ENCOUNTER — Telehealth: Payer: Self-pay

## 2015-05-29 LAB — TB SKIN TEST
INDURATION: 0 mm
TB Skin Test: NEGATIVE

## 2015-05-29 NOTE — Telephone Encounter (Signed)
Patient came in for tb test read----ppd is negative---i have printed report and faxed to [phone] per patient request

## 2015-05-31 ENCOUNTER — Ambulatory Visit (INDEPENDENT_AMBULATORY_CARE_PROVIDER_SITE_OTHER): Payer: Medicare Other | Admitting: Internal Medicine

## 2015-05-31 ENCOUNTER — Encounter: Payer: Self-pay | Admitting: Internal Medicine

## 2015-05-31 VITALS — BP 128/72 | HR 77 | Temp 98.3°F | Resp 20 | Wt 113.0 lb

## 2015-05-31 DIAGNOSIS — I10 Essential (primary) hypertension: Secondary | ICD-10-CM | POA: Diagnosis not present

## 2015-05-31 DIAGNOSIS — F0281 Dementia in other diseases classified elsewhere with behavioral disturbance: Secondary | ICD-10-CM

## 2015-05-31 DIAGNOSIS — G301 Alzheimer's disease with late onset: Principal | ICD-10-CM

## 2015-05-31 DIAGNOSIS — F22 Delusional disorders: Secondary | ICD-10-CM

## 2015-05-31 DIAGNOSIS — Z0289 Encounter for other administrative examinations: Secondary | ICD-10-CM | POA: Diagnosis not present

## 2015-05-31 DIAGNOSIS — G309 Alzheimer's disease, unspecified: Secondary | ICD-10-CM

## 2015-05-31 DIAGNOSIS — F02818 Dementia in other diseases classified elsewhere, unspecified severity, with other behavioral disturbance: Secondary | ICD-10-CM

## 2015-05-31 DIAGNOSIS — Z022 Encounter for examination for admission to residential institution: Secondary | ICD-10-CM | POA: Insufficient documentation

## 2015-05-31 NOTE — Patient Instructions (Signed)
Please continue all other medications as before, and refills have been done if requested.  Please have the pharmacy call with any other refills you may need.  Please continue your efforts at being more active, low cholesterol diet, and weight control.  You are otherwise up to date with prevention measures today.  Please keep your appointments with your specialists as you may have planned  Your forms were filled out today; they will be copied for our records as well  Please return in 6 months, or sooner if needed, to Dr Okey Duprerawford

## 2015-05-31 NOTE — Progress Notes (Signed)
Pre visit review using our clinic review tool, if applicable. No additional management support is needed unless otherwise documented below in the visit note. 

## 2015-05-31 NOTE — Assessment & Plan Note (Addendum)
Pt seen and examined, FL2 and other forms like diet  And prn's filled out as well, also shot record.  Forms to be copied for chart, and hand carried by family today  Note:  Total time for pt hx, exam, review of record with pt in the room, determination of diagnoses and plan for further eval and tx is > 40 min, with over 50% spent in coordination and counseling of patient

## 2015-05-31 NOTE — Assessment & Plan Note (Signed)
stable overall by history and exam, recent data reviewed with pt, and pt to continue medical treatment as before,  to f/u any worsening symptoms or concerns BP Readings from Last 3 Encounters:  05/31/15 128/72  05/22/15 118/60  05/08/15 116/60

## 2015-05-31 NOTE — Assessment & Plan Note (Signed)
stable overall by history and exam, recent data reviewed with pt, and pt to continue medical treatment as before,  to f/u any worsening symptoms or concerns Lab Results  Component Value Date   WBC 5.7 02/06/2015   HGB 11.7* 02/06/2015   HCT 36.2 02/06/2015   PLT 234.0 02/06/2015   GLUCOSE 93 02/06/2015   CHOL 219* 02/06/2015   TRIG 84.0 02/06/2015   HDL 64.20 02/06/2015   LDLCALC 138* 02/06/2015   ALT 10 02/06/2015   AST 20 02/06/2015   NA 135 02/06/2015   K 4.3 02/06/2015   CL 100 02/06/2015   CREATININE 1.18 02/06/2015   BUN 15 02/06/2015   CO2 28 02/06/2015

## 2015-05-31 NOTE — Progress Notes (Signed)
Subjective:    Patient ID: Elizabeth Serrano, female    DOB: [birth year], 82 y.o.   MRN: [id number]  HPI  Pt here with daughter for exam pre-NH.  PCP is out of office today, forms need filled out today.  Here to f/u; overall doing ok,  Pt denies chest pain, increasing sob or doe, wheezing, orthopnea, PND, increased LE swelling, palpitations, dizziness or syncope.  Pt denies new neurological symptoms such as new headache, or facial or extremity weakness or numbness.  Pt denies polydipsia, polyuria, or low sugar episode.   Pt denies new neurological symptoms such as new headache, or facial or extremity weakness or numbness.   Pt states overall good compliance with meds, mostly trying to follow appropriate diet, with wt overall stable,  but little exercise however.  Dementia overall stable symptomatically,, and not assoc with behavioral changes such as hallucinations, paranoia, or agitation.  Denies urinary symptoms such as dysuria, frequency, urgency, flank pain, hematuria or n/v, fever, chills, though has incontinence in the past.  Has rare reflux only , Denies worsening reflux, abd pain, dysphagia, n/v, bowel change or blood.  No current complaints Past Medical History  Diagnosis Date  . Hypertension   . LVH (left ventricular hypertrophy)   . GERD (gastroesophageal reflux disease)   . Dementia    No past surgical history on file.  reports that she quit smoking about 38 years ago. She does not have any smokeless tobacco history on file. Her alcohol and drug histories are not on file. family history is not on file. Allergies  Allergen Reactions  . Penicillins    Current Outpatient Prescriptions on File Prior to Visit  Medication Sig Dispense Refill  . aspirin 81 MG tablet Take 81 mg by mouth daily.    . bimatoprost (LUMIGAN) 0.01 % SOLN Place 1 drop into both eyes at bedtime.    . brimonidine (ALPHAGAN P) 0.1 % SOLN     . donepezil (ARICEPT) 10 MG tablet Take 1 tablet (10 mg total) by mouth at  bedtime. 90 tablet 3  . dorzolamide-timolol (COSOPT) 22.3-6.8 MG/ML ophthalmic solution 1 drop 2 (two) times daily.    . lansoprazole (PREVACID) 15 MG capsule Take 15 mg by mouth as needed.    Marland Kitchen. LORazepam (ATIVAN) 1 MG tablet Take 1 tablet (1 mg total) by mouth 2 (two) times daily as needed (hallucination). 60 tablet 1  . olmesartan-hydrochlorothiazide (BENICAR HCT) 20-12.5 MG tablet Take 1 tablet by mouth daily. 90 tablet 3  . vitamin E 400 UNIT capsule Take 400 Units by mouth daily.     No current facility-administered medications on file prior to visit.   Review of Systems  Constitutional: Negative for unusual diaphoresis or night sweats HENT: Negative for ear swelling or discharge Eyes: Negative for worsening visual haziness  Respiratory: Negative for choking and stridor.   Gastrointestinal: Negative for distension or worsening eructation Genitourinary: Negative for retention or change in urine volume.  Musculoskeletal: Negative for other MSK pain or swelling Skin: Negative for color change and worsening wound Neurological: Negative for tremors and numbness other than noted  Psychiatric/Behavioral: Negative for decreased concentration or agitation other than above       Objective:   Physical Exam BP 128/72 mmHg  Pulse 77  Temp(Src) 98.3 F (36.8 C) (Oral)  Resp 20  Wt 113 lb (51.256 kg)  SpO2 98% VS noted, not ill appearing Constitutional: Pt appears in no apparent distress HENT: Head: NCAT.  Right Ear: External ear  normal.  Left Ear: External ear normal.  Eyes: . Pupils are equal, round, and reactive to light. Conjunctivae and EOM are normal Neck: Normal range of motion. Neck supple.  Cardiovascular: Normal rate and regular rhythm.   Pulmonary/Chest: Effort normal and breath sounds without rales or wheezing.  Abd:  Soft, NT, ND, + BS Neurological: Pt is alert. Not confused , motor grossly intact Skin: Skin is warm. No rash, no LE edema Psychiatric: Pt behavior is  normal. No agitation. Pleasantly demented    Assessment & Plan:

## 2015-05-31 NOTE — Telephone Encounter (Signed)
Paperwork completed and signed after OV with Dr Jonny RuizJohn 05/31/2015.  Given to pt's son in office by Judee ClaraJulie Johnson

## 2015-06-03 ENCOUNTER — Telehealth: Payer: Self-pay

## 2015-06-03 NOTE — Telephone Encounter (Signed)
[facility] [phone] to see if the patient was in snf / AL or at home; Can see in combination with visit to see Dr. Okey Duprerawford in June

## 2015-06-11 ENCOUNTER — Other Ambulatory Visit: Payer: Self-pay | Admitting: Internal Medicine

## 2015-06-23 ENCOUNTER — Other Ambulatory Visit: Payer: Self-pay | Admitting: Internal Medicine

## 2015-06-25 ENCOUNTER — Other Ambulatory Visit: Payer: Self-pay | Admitting: Internal Medicine

## 2015-08-06 ENCOUNTER — Ambulatory Visit: Payer: Medicare Other | Admitting: Internal Medicine

## 2015-08-15 ENCOUNTER — Other Ambulatory Visit: Payer: Self-pay | Admitting: Internal Medicine

## 2015-08-15 NOTE — Telephone Encounter (Signed)
Please advise 

## 2015-08-16 NOTE — Telephone Encounter (Signed)
Printed and signed.  

## 2015-08-16 NOTE — Telephone Encounter (Signed)
Faxed to pharmacy

## 2015-12-21 ENCOUNTER — Encounter (HOSPITAL_COMMUNITY): Payer: Self-pay | Admitting: Emergency Medicine

## 2015-12-21 ENCOUNTER — Emergency Department (HOSPITAL_COMMUNITY)
Admission: EM | Admit: 2015-12-21 | Discharge: 2015-12-21 | Disposition: A | Discharge status: Home / Self Care | Payer: Medicare Other | Attending: Emergency Medicine | Admitting: Emergency Medicine

## 2015-12-21 DIAGNOSIS — Z79899 Other long term (current) drug therapy: Secondary | ICD-10-CM | POA: Diagnosis not present

## 2015-12-21 DIAGNOSIS — Y999 Unspecified external cause status: Secondary | ICD-10-CM | POA: Insufficient documentation

## 2015-12-21 DIAGNOSIS — S40021A Contusion of right upper arm, initial encounter: Secondary | ICD-10-CM | POA: Diagnosis not present

## 2015-12-21 DIAGNOSIS — I10 Essential (primary) hypertension: Secondary | ICD-10-CM | POA: Diagnosis not present

## 2015-12-21 DIAGNOSIS — Z7982 Long term (current) use of aspirin: Secondary | ICD-10-CM | POA: Diagnosis not present

## 2015-12-21 DIAGNOSIS — Z87891 Personal history of nicotine dependence: Secondary | ICD-10-CM | POA: Insufficient documentation

## 2015-12-21 DIAGNOSIS — Y929 Unspecified place or not applicable: Secondary | ICD-10-CM | POA: Insufficient documentation

## 2015-12-21 DIAGNOSIS — Y939 Activity, unspecified: Secondary | ICD-10-CM | POA: Diagnosis not present

## 2015-12-21 DIAGNOSIS — S4991XA Unspecified injury of right shoulder and upper arm, initial encounter: Secondary | ICD-10-CM | POA: Diagnosis present

## 2015-12-21 NOTE — ED Notes (Signed)
Bed: WA20 Expected date:  Expected time:  Means of arrival:  Comments: 

## 2015-12-21 NOTE — ED Notes (Signed)
[facility] here to take pt back to [facility] 
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
## 2015-12-21 NOTE — ED Notes (Signed)
Attempt[facility] to [facility]. No answer at facility. [facility] called to [facility].

## 2015-12-21 NOTE — Discharge Instructions (Signed)
No specific instructions.    No limitations of activity.

## 2016-05-04 ENCOUNTER — Emergency Department (HOSPITAL_COMMUNITY)
Admission: EM | Admit: 2016-05-04 | Discharge: 2016-05-05 | Disposition: A | Discharge status: Home / Self Care | Payer: Medicare Other | Attending: Emergency Medicine | Admitting: Emergency Medicine

## 2016-05-04 ENCOUNTER — Encounter (HOSPITAL_COMMUNITY): Payer: Self-pay | Admitting: Emergency Medicine

## 2016-05-04 DIAGNOSIS — Z79899 Other long term (current) drug therapy: Secondary | ICD-10-CM | POA: Diagnosis not present

## 2016-05-04 DIAGNOSIS — Z7982 Long term (current) use of aspirin: Secondary | ICD-10-CM | POA: Insufficient documentation

## 2016-05-04 DIAGNOSIS — L249 Irritant contact dermatitis, unspecified cause: Secondary | ICD-10-CM | POA: Insufficient documentation

## 2016-05-04 DIAGNOSIS — I10 Essential (primary) hypertension: Secondary | ICD-10-CM | POA: Insufficient documentation

## 2016-05-04 DIAGNOSIS — R21 Rash and other nonspecific skin eruption: Secondary | ICD-10-CM | POA: Diagnosis present

## 2016-05-04 DIAGNOSIS — Z87891 Personal history of nicotine dependence: Secondary | ICD-10-CM | POA: Insufficient documentation

## 2016-05-04 DIAGNOSIS — L89222 Pressure ulcer of left hip, stage 2: Secondary | ICD-10-CM | POA: Diagnosis not present

## 2016-05-04 LAB — COMPREHENSIVE METABOLIC PANEL
ALK PHOS: 85 U/L (ref 38–126)
ALT: 15 U/L (ref 14–54)
AST: 27 U/L (ref 15–41)
Albumin: 3.4 g/dL — ABNORMAL LOW (ref 3.5–5.0)
Anion gap: 8 (ref 5–15)
BILIRUBIN TOTAL: 0.4 mg/dL (ref 0.3–1.2)
BUN: 21 mg/dL — AB (ref 6–20)
CHLORIDE: 99 mmol/L — AB (ref 101–111)
CO2: 25 mmol/L (ref 22–32)
CREATININE: 1.28 mg/dL — AB (ref 0.44–1.00)
Calcium: 8.6 mg/dL — ABNORMAL LOW (ref 8.9–10.3)
GFR calc Af Amer: 40 mL/min — ABNORMAL LOW (ref >60)
GFR, EST NON AFRICAN AMERICAN: 35 mL/min — AB (ref >60)
Glucose, Bld: 94 mg/dL (ref 65–99)
Potassium: 3.8 mmol/L (ref 3.5–5.1)
Sodium: 132 mmol/L — ABNORMAL LOW (ref 135–145)
Total Protein: 6.3 g/dL — ABNORMAL LOW (ref 6.5–8.1)

## 2016-05-04 LAB — CBC WITH DIFFERENTIAL/PLATELET
Basophils Absolute: 0 10*3/uL (ref 0.0–0.1)
Basophils Relative: 0 %
EOS ABS: 2.1 10*3/uL — AB (ref 0.0–0.7)
EOS PCT: 23 %
HCT: 33.2 % — ABNORMAL LOW (ref 36.0–46.0)
Hemoglobin: 11.4 g/dL — ABNORMAL LOW (ref 12.0–15.0)
LYMPHS ABS: 1.4 10*3/uL (ref 0.7–4.0)
Lymphocytes Relative: 15 %
MCH: 28 pg (ref 26.0–34.0)
MCHC: 34.3 g/dL (ref 30.0–36.0)
MCV: 81.6 fL (ref 78.0–100.0)
Monocytes Absolute: 1.3 10*3/uL — ABNORMAL HIGH (ref 0.1–1.0)
Monocytes Relative: 14 %
Neutro Abs: 4.4 10*3/uL (ref 1.7–7.7)
Neutrophils Relative %: 48 %
PLATELETS: 311 10*3/uL (ref 150–400)
RBC: 4.07 MIL/uL (ref 3.87–5.11)
RDW: 13.8 % (ref 11.5–15.5)
WBC: 9.1 10*3/uL (ref 4.0–10.5)

## 2016-05-04 MED ORDER — TRIAMCINOLONE ACETONIDE 0.1 % EX CREA: 1.0000 "application " | TOPICAL_CREAM | Freq: Three times a day (TID) | CUTANEOUS | 0 refills | Status: AC

## 2016-05-04 MED ORDER — HYDROXYZINE HCL 25 MG PO TABS: 25.0000 mg | ORAL_TABLET | ORAL | 0 refills | Status: AC | PRN

## 2016-05-04 MED ORDER — HYDROXYZINE HCL 25 MG PO TABS
25.0000 mg | ORAL_TABLET | Freq: Once | ORAL | Status: AC
  Administered 2016-05-04: 25 mg via ORAL
  Filled 2016-05-04: qty 1

## 2016-05-04 NOTE — ED Notes (Signed)
Bed: WA20 Expected date:  Expected time:  Means of arrival:  Comments: 83 yo bed sore

## 2016-05-04 NOTE — ED Triage Notes (Signed)
[facility] is [facility].  Pt is confused at baseline but it comes and goes.  Pt is alert to self.  Pt c/o being itchy.  Pt was sent here by physician at home for "bed sores".

## 2016-05-04 NOTE — ED Notes (Signed)
[facility] notified of need for Pt transport 

## 2016-05-04 NOTE — ED Provider Notes (Signed)
[facility]-EMERGENCY DEPT Provider Note   CSN: [id number] Arrival date & time: 05/04/16  2017     History   Chief Complaint Chief Complaint  Patient presents with  . Wound Check    HPI Elizabeth Serrano is a 83 y.o. female.  The history is provided by the patient.  Rash   This is a recurrent problem. The current episode started more than 2 days ago. The problem has not changed since onset.The problem is associated with an unknown factor. There has been no fever. Affected Location: All 4 extremities and trunk. The pain is moderate. The pain has been fluctuating since onset. Associated symptoms include itching. Treatments tried: Hydrocortisone cream. The treatment provided no relief.    Past Medical History:  Diagnosis Date  . Dementia   . GERD (gastroesophageal reflux disease)   . Hypertension   . LVH (left ventricular hypertrophy)     Patient Active Problem List   Diagnosis Date Noted  . Encounter for examination for admission to nursing home 05/31/2015  . Urinary incontinence in female 05/12/2015  . Dementia of the Alzheimer's type, with late onset, with delusions (HCC) 02/07/2015  . Essential hypertension 02/07/2015  . GERD (gastroesophageal reflux disease) 02/07/2015    History reviewed. No pertinent surgical history.  OB History    No data available       Home Medications    Prior to Admission medications   Medication Sig Start Date End Date Taking? Authorizing Provider  acetaminophen (TYLENOL) 500 MG tablet Take 500 mg by mouth every 4 (four) hours as needed for moderate pain.   Yes Historical Provider, MD  Alum & Mag Hydroxide-Simeth (ALMACONE PO) Take 30 mLs by mouth every 8 (eight) hours as needed (indigestion).   Yes Historical Provider, MD  aspirin 81 MG tablet Take 81 mg by mouth daily.   Yes Historical Provider, MD  brimonidine (ALPHAGAN P) 0.1 % SOLN Place 2 drops into both eyes daily.    Yes Historical Provider, MD  donepezil (ARICEPT) 10 MG tablet TAKE 1  TABLET BY MOUTH EVERY NIGHT AT BEDTIME 06/25/15  Yes Corwin LevinsJames W John, MD  dorzolamide-timolol (COSOPT) 22.3-6.8 MG/ML ophthalmic solution 1 drop 2 (two) times daily.   Yes Historical Provider, MD  guaifenesin (ROBITUSSIN) 100 MG/5ML syrup Take 200 mg by mouth 3 (three) times daily as needed for cough.   Yes Historical Provider, MD  hydrocortisone cream 1 % Apply 1 application topically 2 (two) times daily as needed for itching.   Yes Historical Provider, MD  lansoprazole (PREVACID) 15 MG capsule Take 30 mg by mouth daily.    Yes Historical Provider, MD  loperamide (IMODIUM A-D) 2 MG tablet Take 2 mg by mouth 4 (four) times daily as needed for diarrhea or loose stools.   Yes Historical Provider, MD  LORazepam (ATIVAN) 1 MG tablet TAKE 1 TABLET BY MOUTH TWICE DAILY AS NEEDED FOR HALLUCINATION 08/16/15  Yes Myrlene BrokerElizabeth A Crawford, MD  LUMIGAN 0.01 % SOLN INSTILL ONE DROP INTO BOTH EYES EVERY NIGHT AT BEDTIME 06/11/15  Yes Corwin LevinsJames W John, MD  magnesium hydroxide (MILK OF MAGNESIA) 400 MG/5ML suspension Take 30 mLs by mouth at bedtime as needed for mild constipation.   Yes Historical Provider, MD  neomycin-bacitracin-polymyxin (NEOSPORIN) [phone] ointment Apply topically. Clean area with NS, apply neosporin or equivalent, cover with band aid or guaze and tape, change as needed until healed.   Yes Historical Provider, MD  olmesartan-hydrochlorothiazide (BENICAR HCT) 20-12.5 MG tablet TAKE 1 TABLET BY MOUTH DAILY  06/25/15  Yes Corwin Levins, MD  trolamine salicylate (ASPERCREME) 10 % cream Apply 1 application topically 2 (two) times daily as needed for muscle pain.   Yes Historical Provider, MD  vitamin E 400 UNIT capsule Take 400 Units by mouth daily.   Yes Historical Provider, MD  QUEtiapine (SEROQUEL) 100 MG tablet TAKE 1 TABLET (100 MG TOTAL) BY MOUTH AT BEDTIME AS NEEDED. Patient not taking: Reported on 05/04/2016 06/24/15   Myrlene Broker, MD    Family History History reviewed. No pertinent family  history.  Social History Social History  Substance Use Topics  . Smoking status: Former Smoker    Quit date: 02/23/1977  . Smokeless tobacco: Not on file  . Alcohol use Not on file     Allergies   Penicillins   Review of Systems Review of Systems  Skin: Positive for itching and rash.  All other systems reviewed and are negative.    Physical Exam Updated Vital Signs BP 128/61 (BP Location: Left Arm)   Pulse 70   Temp 98 F (36.7 C) (Oral)   Resp 16   SpO2 100%   Physical Exam  Constitutional: She is oriented to person, place, and time. She appears well-developed and well-nourished. No distress.  HENT:  Head: Normocephalic and atraumatic.  Nose: Nose normal.  Eyes: Conjunctivae are normal.  Neck: Neck supple. No tracheal deviation present.  Cardiovascular: Normal rate, regular rhythm and normal heart sounds.   Pulmonary/Chest: Effort normal and breath sounds normal. No respiratory distress.  Abdominal: Soft. She exhibits no distension. There is no tenderness.  Neurological: She is alert and oriented to person, place, and time.  Skin: Skin is warm and dry. Rash (Diffuse, pruritic patches with mild erythema, no warmth or induration or fluctuance. Excoriations overlying) noted.  1 cm left posterior hip stage II decubitus ulceration without erythema, fluctuance, induration or tenderness. No purulent drainage  Psychiatric: She has a normal mood and affect. Her behavior is normal.  Vitals reviewed.    ED Treatments / Results  Labs (all labs ordered are listed, but only abnormal results are displayed) Labs Reviewed  COMPREHENSIVE METABOLIC PANEL - Abnormal; Notable for the following:       Result Value   Sodium 132 (*)    Chloride 99 (*)    BUN 21 (*)    Creatinine, Ser 1.28 (*)    Calcium 8.6 (*)    Total Protein 6.3 (*)    Albumin 3.4 (*)    GFR calc non Af Amer 35 (*)    GFR calc Af Amer 40 (*)    All other components within normal limits  CBC WITH  DIFFERENTIAL/PLATELET - Abnormal; Notable for the following:    Hemoglobin 11.4 (*)    HCT 33.2 (*)    Monocytes Absolute 1.3 (*)    Eosinophils Absolute 2.1 (*)    All other components within normal limits    EKG  EKG Interpretation None       Radiology No results found.  Procedures Procedures (including critical care time)  Medications Ordered in ED Medications  hydrOXYzine (ATARAX/VISTARIL) tablet 25 mg (25 mg Oral Given 05/04/16 2204)     Initial Impression / Assessment and Plan / ED Course  I have reviewed the triage vital signs and the nursing notes.  Pertinent labs & imaging results that were available during my care of the patient were reviewed by me and considered in my medical decision making (see chart for details).  83 y.o. female presents with Itching that has been ongoing for a few days. She recently started hydrocortisone therapy yesterday but has not responded well to it. She continues to have a diffuse pruritic rash and has started to develop a left posterior hip decubitus ulceration. She sits in the television room at her facility in one spot often for multiple hours per day which is the likely etiology of her bed sore. Labs were performed today for screening of liver function but there is no hyperbilirubinemia to explain the patient's itching. Her rash is consistent with a mild diffuse contact dermatitis. She will be provided Atarax for help with itching and topical Kenalog cream to treat her more aggressively. Recommend a primary care follow-up in possible referral to dermatology if she does not respond. I would not recommend systemic steroids given the side effect profile in a 83 year old. Family and patient were updated on plan of care. Also will need monitoring and frequent mobility during sitting episodes to prevent worsening.  Final Clinical Impressions(s) / ED Diagnoses   Final diagnoses:  Irritant contact dermatitis, unspecified trigger  Decubitus  ulcer of left hip, stage 2    New Prescriptions New Prescriptions   HYDROXYZINE (ATARAX/VISTARIL) 25 MG TABLET    Take 1 tablet (25 mg total) by mouth every 4 (four) hours as needed for itching.   TRIAMCINOLONE CREAM (KENALOG) 0.1 %    Apply 1 application topically 3 (three) times daily. All affected areas except face     Lyndal Pulley, MD 05/04/16 2344

## 2019-04-01 ENCOUNTER — Encounter (HOSPITAL_COMMUNITY): Payer: Self-pay | Admitting: Emergency Medicine

## 2019-04-01 ENCOUNTER — Emergency Department (HOSPITAL_COMMUNITY): Payer: Medicare PPO

## 2019-04-01 ENCOUNTER — Emergency Department (HOSPITAL_COMMUNITY)
Admission: EM | Admit: 2019-04-01 | Discharge: 2019-04-01 | Disposition: A | Discharge status: Home / Self Care | Payer: Medicare PPO | Attending: Emergency Medicine | Admitting: Emergency Medicine

## 2019-04-01 ENCOUNTER — Other Ambulatory Visit: Payer: Self-pay

## 2019-04-01 DIAGNOSIS — Y999 Unspecified external cause status: Secondary | ICD-10-CM | POA: Insufficient documentation

## 2019-04-01 DIAGNOSIS — I10 Essential (primary) hypertension: Secondary | ICD-10-CM | POA: Insufficient documentation

## 2019-04-01 DIAGNOSIS — W010XXA Fall on same level from slipping, tripping and stumbling without subsequent striking against object, initial encounter: Secondary | ICD-10-CM | POA: Diagnosis not present

## 2019-04-01 DIAGNOSIS — Y92129 Unspecified place in nursing home as the place of occurrence of the external cause: Secondary | ICD-10-CM | POA: Insufficient documentation

## 2019-04-01 DIAGNOSIS — S0083XA Contusion of other part of head, initial encounter: Secondary | ICD-10-CM

## 2019-04-01 DIAGNOSIS — F0390 Unspecified dementia without behavioral disturbance: Secondary | ICD-10-CM | POA: Diagnosis not present

## 2019-04-01 DIAGNOSIS — W19XXXA Unspecified fall, initial encounter: Secondary | ICD-10-CM

## 2019-04-01 DIAGNOSIS — Z87891 Personal history of nicotine dependence: Secondary | ICD-10-CM | POA: Insufficient documentation

## 2019-04-01 DIAGNOSIS — Z7982 Long term (current) use of aspirin: Secondary | ICD-10-CM | POA: Diagnosis not present

## 2019-04-01 DIAGNOSIS — Y9301 Activity, walking, marching and hiking: Secondary | ICD-10-CM | POA: Diagnosis not present

## 2019-04-01 DIAGNOSIS — Z79899 Other long term (current) drug therapy: Secondary | ICD-10-CM | POA: Diagnosis not present

## 2019-04-01 DIAGNOSIS — S0990XA Unspecified injury of head, initial encounter: Secondary | ICD-10-CM | POA: Diagnosis present

## 2019-04-01 NOTE — ED Notes (Signed)
[facility] called for transportation  

## 2019-04-01 NOTE — ED Provider Notes (Signed)
[facility]-EMERGENCY DEPT Provider Note   CSN: [id number] Arrival date & time: 04/01/19  1401     History Chief Complaint  Patient presents with  . Fall    Elizabeth Serrano is a 86 y.o. female dementia, reflux, LVH, hypertension who presents by EMS after mechanical fall.  Patient was walking with her walker when she tripped and fell and hit the left side of her head.  She has a developing hematoma.  She just got her second Covid vaccination today.  She is presenting to the emergency department sent in by her staff for evaluation after fall.  She is unable to provide history secondary to dementia.  HPI     Past Medical History:  Diagnosis Date  . Dementia (HCC)   . GERD (gastroesophageal reflux disease)   . Hypertension   . LVH (left ventricular hypertrophy)     Patient Active Problem List   Diagnosis Date Noted  . Encounter for examination for admission to nursing home 05/31/2015  . Urinary incontinence in female 05/12/2015  . Dementia of the Alzheimer's type, with late onset, with delusions (HCC) 02/07/2015  . Essential hypertension 02/07/2015  . GERD (gastroesophageal reflux disease) 02/07/2015    History reviewed. No pertinent surgical history.   OB History   No obstetric history on file.     History reviewed. No pertinent family history.  Social History   Tobacco Use  . Smoking status: Former Smoker    Quit date: 02/23/1977    Years since quitting: 42.1  Substance Use Topics  . Alcohol use: Not Currently  . Drug use: Not Currently    Home Medications Prior to Admission medications   Medication Sig Start Date End Date Taking? Authorizing Provider  acetaminophen (TYLENOL) 500 MG tablet Take 500 mg by mouth every 6 (six) hours as needed for fever.    Yes [provider]  Alum & Mag Hydroxide-Simeth (ALMACONE PO) Take 30 mLs by mouth as needed (indigestion).    Yes [provider]  aspirin 81 MG tablet Take 81 mg by mouth  daily.   Yes [provider]  augmented betamethasone dipropionate (DIPROLENE-AF) 0.05 % cream Apply 1 application topically daily. Apply topically to affected area rash after bathing once daily (avoid on face) 11/17/18  Yes [provider]  brimonidine (ALPHAGAN P) 0.1 % SOLN Place 2 drops into both eyes daily.    Yes [provider]  donepezil (ARICEPT) 10 MG tablet TAKE 1 TABLET BY MOUTH EVERY NIGHT AT BEDTIME Patient taking differently: Take 10 mg by mouth at bedtime.  06/25/15  Yes Corwin Levins, MD  dorzolamide-timolol (COSOPT) 22.3-6.8 MG/ML ophthalmic solution Place 1 drop into both eyes 2 (two) times daily.    Yes [provider]  guaifenesin (ROBITUSSIN) 100 MG/5ML syrup Take 200 mg by mouth every 6 (six) hours as needed for cough.    Yes [provider]  hydrOXYzine (ATARAX/VISTARIL) 25 MG tablet Take 1 tablet (25 mg total) by mouth every 4 (four) hours as needed for itching. 05/04/16  Yes Lyndal Pulley, MD  lansoprazole (PREVACID) 15 MG capsule Take 30 mg by mouth daily.    Yes [provider]  loperamide (IMODIUM A-D) 2 MG tablet Take 2 mg by mouth as needed for diarrhea or loose stools. Do not exceed 8 doses in 24 hours   Yes [provider]  LUMIGAN 0.01 % SOLN INSTILL ONE DROP INTO BOTH EYES EVERY NIGHT AT BEDTIME Patient taking differently: Place  1 drop into both eyes at bedtime.  06/11/15  Yes Biagio Borg, MD  magnesium hydroxide (MILK OF MAGNESIA) 400 MG/5ML suspension Take 30 mLs by mouth at bedtime as needed for mild constipation.   Yes [provider]  neomycin-bacitracin-polymyxin (NEOSPORIN) [phone] ointment Apply 1 application topically as needed (wounds). Clean area with NS, apply neosporin or equivalent, cover with band aid or guaze and tape, change as needed until healed.    Yes [provider]  triamcinolone cream (KENALOG) 0.1 % Apply 1 application topically 2 (two) times daily. 03/28/19  Yes  [provider]  triamcinolone cream (KENALOG) 0.5 % Apply 1 application topically daily as needed for itching. 03/29/19  Yes [provider]  Vitamin D3 (VITAMIN D) 25 MCG tablet Take 1,000 Units by mouth daily.   Yes [provider]  vitamin E 400 UNIT capsule Take 400 Units by mouth daily.   Yes [provider]  Zinc Gluconate 100 MG TABS Take 100 mg by mouth daily.   Yes [provider]  LORazepam (ATIVAN) 1 MG tablet TAKE 1 TABLET BY MOUTH TWICE DAILY AS NEEDED FOR HALLUCINATION Patient not taking: Reported on 04/01/2019 08/16/15   Hoyt Koch, MD  olmesartan-hydrochlorothiazide (BENICAR HCT) 20-12.5 MG tablet TAKE 1 TABLET BY MOUTH DAILY Patient not taking: Reported on 04/01/2019 06/25/15   Biagio Borg, MD  QUEtiapine (SEROQUEL) 100 MG tablet TAKE 1 TABLET (100 MG TOTAL) BY MOUTH AT BEDTIME AS NEEDED. Patient not taking: Reported on 05/04/2016 06/24/15   Hoyt Koch, MD    Allergies    Penicillins  Review of Systems   Review of Systems Ten systems reviewed and are negative for acute change, except as noted in the HPI.   Physical Exam Updated Vital Signs BP 109/63   Pulse 83   Temp 98 F (36.7 C) (Oral)   Resp 13   SpO2 99%   Physical Exam Vitals and nursing note reviewed.  Constitutional:      General: She is not in acute distress.    Appearance: She is well-developed. She is not diaphoretic.  HENT:     Head: Normocephalic.     Comments: Hematoma to the left side of the forehead.  No other evidence of trauma to the head or neck.  Moves eyes in all direction without pain Eyes:     General: No scleral icterus.    Conjunctiva/sclera: Conjunctivae normal.  Cardiovascular:     Rate and Rhythm: Normal rate and regular rhythm.     Heart sounds: Normal heart sounds. No murmur. No friction rub. No gallop.   Pulmonary:     Effort: Pulmonary effort is normal. No respiratory distress.     Breath sounds: Normal breath  sounds.  Abdominal:     General: Bowel sounds are normal. There is no distension.     Palpations: Abdomen is soft. There is no mass.     Tenderness: There is no abdominal tenderness. There is no guarding.  Musculoskeletal:     Cervical back: Normal range of motion.  Skin:    General: Skin is warm and dry.  Neurological:     Mental Status: She is alert and oriented to person, place, and time.  Psychiatric:        Behavior: Behavior normal.     ED Results / Procedures / Treatments   Labs (all labs ordered are listed, but only abnormal results are displayed) Labs Reviewed - No data to display  EKG None  Radiology No results found.  Procedures Procedures (including critical care time)  Medications Ordered in ED Medications - No data to display  ED Course  I have reviewed the triage vital signs and the nursing notes.  Pertinent labs & imaging results that were available during my care of the patient were reviewed by me and considered in my medical decision making (see chart for details).  Clinical Course as of Apr 01 1915  Sat Apr 01, 2019  1637 Patient found sitting next to her bed on the floor.  I reexamined her.  She is moving all extremities without pain.  No obvious bruising or swelling.  No sacral, hip or midline spinal tenderness.   [AH]    Clinical Course User Index [AH] Arthor Captain, PA-C   MDM Rules/Calculators/A&P                      86 year old female here with mechanical fall.  I personally reviewed the CT head, C-spine and maxillofacial which showed no acute abnormality.  Patient did have a potential second fall here as she is confused and high fall risk.  She was placed in the hallway under direct observation.  I reassessed the patient who is able to move all her limbs without any pain.  She has no new bruising or other abnormalities.  Patient appears appropriate for discharge at this time. Final Clinical Impression(s) / ED Diagnoses Final diagnoses:    None    Rx / DC Orders ED Discharge Orders    None       Arthor Captain, PA-C 04/01/19 1925    Lorre Nick, MD 04/04/19 [phone]

## 2019-04-01 NOTE — ED Triage Notes (Signed)
While at [facility], the patient was walking with her walker when she fell and hit her head. She now has a hematoma above her left eye with ecchymosis under that same eye. She did receive her 2nd covid vaccine today. HX Dementia, baseline A&O to person.  EMS vitals: 116/82 BP 74 HR 99% O2 sat on room air 97.5 Temp

## 2019-04-01 NOTE — ED Notes (Signed)
Patient has tried to get out of the bed on multiple times. Attempt to re- orient have failed. PA made aware.

## 2019-04-01 NOTE — ED Provider Notes (Signed)
Medical screening examination/treatment/procedure(s) were conducted as a shared visit with non-physician practitioner(s) and myself.  I personally evaluated the patient during the encounter.    86 year old female here after a witnessed fall.  Will image head neck and likely discharge   Lorre Nick, MD 04/01/19 1541

## 2019-04-01 NOTE — ED Notes (Signed)
   04/01/19 1900  What Happened  Was fall witnessed? No (Fall at 1630)  Was patient injured? No  Patient found on floor  Found by Staff-comment [facility])  Stated prior activity other (comment)  Follow Up  MD notified Abigail PA  Additional tests No  Simple treatment Other (comment) (NA)  Adult Fall Risk Assessment  Risk Factor Category (scoring not indicated) History of more than one fall within 6 months before admission (document High fall risk)  Patient Fall Risk Level High fall risk  Adult Fall Risk Interventions  Required Bundle Interventions *See Row Information* High fall risk - low, moderate, and high requirements implemented  Additional Interventions Room near nurses station  Neurological  Neuro (WDL) WDL  Musculoskeletal  Musculoskeletal (WDL) WDL  Integumentary  Integumentary (WDL) WDL

## 2019-04-01 NOTE — ED Notes (Addendum)
Tech was watching the patient while RN communicated with PA. Tech stepped out for a moment. When she walked back in she found the patient sitting on the floor. Unsure if the patient fell. Patient denies falling. Possible fall. PA made aware and assessed the patient. Unable to obtain posey belt at this time.

## 2019-04-01 NOTE — Discharge Instructions (Signed)
Get help right away if: You have severe pain or a headache that is not relieved by medicine. You have unusual sleepiness, confusion, or personality changes. You vomit. You have a nosebleed that does not stop. You have double vision or blurred vision. You have a continuous clear fluid draining from your nose or ear. You have trouble walking or using your arms or legs. You have severe dizziness. 

## 2019-08-12 ENCOUNTER — Emergency Department (HOSPITAL_COMMUNITY): Payer: Medicare PPO

## 2019-08-12 ENCOUNTER — Emergency Department (HOSPITAL_COMMUNITY)
Admission: EM | Admit: 2019-08-12 | Discharge: 2019-08-13 | Disposition: A | Discharge status: Home / Self Care | Payer: Medicare PPO | Attending: Emergency Medicine | Admitting: Emergency Medicine

## 2019-08-12 ENCOUNTER — Encounter (HOSPITAL_COMMUNITY): Payer: Self-pay

## 2019-08-12 DIAGNOSIS — Z7982 Long term (current) use of aspirin: Secondary | ICD-10-CM | POA: Insufficient documentation

## 2019-08-12 DIAGNOSIS — F0390 Unspecified dementia without behavioral disturbance: Secondary | ICD-10-CM | POA: Insufficient documentation

## 2019-08-12 DIAGNOSIS — I10 Essential (primary) hypertension: Secondary | ICD-10-CM | POA: Insufficient documentation

## 2019-08-12 DIAGNOSIS — Z9181 History of falling: Secondary | ICD-10-CM

## 2019-08-12 DIAGNOSIS — M25561 Pain in right knee: Secondary | ICD-10-CM | POA: Insufficient documentation

## 2019-08-12 DIAGNOSIS — Z87891 Personal history of nicotine dependence: Secondary | ICD-10-CM | POA: Diagnosis not present

## 2019-08-12 DIAGNOSIS — M1711 Unilateral primary osteoarthritis, right knee: Secondary | ICD-10-CM

## 2019-08-12 DIAGNOSIS — M25551 Pain in right hip: Secondary | ICD-10-CM | POA: Insufficient documentation

## 2019-08-12 DIAGNOSIS — Z79899 Other long term (current) drug therapy: Secondary | ICD-10-CM | POA: Diagnosis not present

## 2019-08-12 MED ORDER — ACETAMINOPHEN 325 MG PO TABS
650.0000 mg | ORAL_TABLET | Freq: Once | ORAL | Status: AC
  Administered 2019-08-12: 650 mg via ORAL
  Filled 2019-08-12: qty 2

## 2019-08-12 NOTE — ED Notes (Signed)
Report given to the nurse at [facility].

## 2019-08-12 NOTE — ED Notes (Signed)
Radiology advised pt needed to be cleaned up.  This Clinical research associate and Engineer, technical sales moved pt into an unoccupied room, cleaned pt and replaced soiled bedding with clean linen. Pt moved back to hallway.

## 2019-08-12 NOTE — ED Provider Notes (Signed)
[facility]   CSN: [id number] Arrival date & time: 08/12/19  1341     History No chief complaint on file.   Elizabeth Serrano is a 86 y.o. female.  Patient arrives to ED from [facility] via EMS, w report of fall 2 days ago and right leg ?right knee pain since fall. Patient very limited historian, hx dementia - level 5 caveat. Pt normally walks on own, but has been noted to have limited mobility due to right leg pain for past day. w fall, no report LOC, and no change in mental status noted since fall. Pt indicates she 'fine'. Other than above, pts overall condition, mental status, and functional ability noted to be c/w baseline.   The history is provided by the patient and the EMS personnel. The history is limited by the condition of the patient.       Past Medical History:  Diagnosis Date  . Dementia (HCC)   . GERD (gastroesophageal reflux disease)   . Hypertension   . LVH (left ventricular hypertrophy)     Patient Active Problem List   Diagnosis Date Noted  . Encounter for examination for admission to nursing home 05/31/2015  . Urinary incontinence in female 05/12/2015  . Dementia of the Alzheimer's type, with late onset, with delusions (HCC) 02/07/2015  . Essential hypertension 02/07/2015  . GERD (gastroesophageal reflux disease) 02/07/2015    No past surgical history on file.   OB History   No obstetric history on file.     No family history on file.  Social History   Tobacco Use  . Smoking status: Former Smoker    Quit date: 02/23/1977    Years since quitting: 42.4  Substance Use Topics  . Alcohol use: Not Currently  . Drug use: Not Currently    Home Medications Prior to Admission medications   Medication Sig Start Date End Date Taking? Authorizing Provider  acetaminophen (TYLENOL) 500 MG tablet Take 500 mg by mouth every 6 (six) hours as needed for fever.     [provider]  Alum & Mag Hydroxide-Simeth  (ALMACONE PO) Take 30 mLs by mouth as needed (indigestion).     [provider]  aspirin 81 MG tablet Take 81 mg by mouth daily.    [provider]  augmented betamethasone dipropionate (DIPROLENE-AF) 0.05 % cream Apply 1 application topically daily. Apply topically to affected area rash after bathing once daily (avoid on face) 11/17/18   [provider]  brimonidine (ALPHAGAN P) 0.1 % SOLN Place 2 drops into both eyes daily.     [provider]  donepezil (ARICEPT) 10 MG tablet TAKE 1 TABLET BY MOUTH EVERY NIGHT AT BEDTIME Patient taking differently: Take 10 mg by mouth at bedtime.  06/25/15   Corwin Levins, MD  dorzolamide-timolol (COSOPT) 22.3-6.8 MG/ML ophthalmic solution Place 1 drop into both eyes 2 (two) times daily.     [provider]  guaifenesin (ROBITUSSIN) 100 MG/5ML syrup Take 200 mg by mouth every 6 (six) hours as needed for cough.     [provider]  hydrOXYzine (ATARAX/VISTARIL) 25 MG tablet Take 1 tablet (25 mg total) by mouth every 4 (four) hours as needed for itching. 05/04/16   Lyndal Pulley, MD  lansoprazole (PREVACID) 15 MG capsule Take 30 mg by mouth daily.     [provider]  loperamide (IMODIUM A-D) 2 MG tablet Take 2 mg by mouth as needed for diarrhea or loose stools.  Do not exceed 8 doses in 24 hours    [provider]  LORazepam (ATIVAN) 1 MG tablet TAKE 1 TABLET BY MOUTH TWICE DAILY AS NEEDED FOR HALLUCINATION Patient not taking: Reported on 04/01/2019 08/16/15   Hoyt Koch, MD  LUMIGAN 0.01 % SOLN INSTILL ONE DROP INTO BOTH EYES EVERY NIGHT AT BEDTIME Patient taking differently: Place 1 drop into both eyes at bedtime.  06/11/15   Biagio Borg, MD  magnesium hydroxide (MILK OF MAGNESIA) 400 MG/5ML suspension Take 30 mLs by mouth at bedtime as needed for mild constipation.    [provider]  neomycin-bacitracin-polymyxin (NEOSPORIN) [phone] ointment Apply 1 application topically as  needed (wounds). Clean area with NS, apply neosporin or equivalent, cover with band aid or guaze and tape, change as needed until healed.     [provider]  olmesartan-hydrochlorothiazide (BENICAR HCT) 20-12.5 MG tablet TAKE 1 TABLET BY MOUTH DAILY Patient not taking: Reported on 04/01/2019 06/25/15   Biagio Borg, MD  QUEtiapine (SEROQUEL) 100 MG tablet TAKE 1 TABLET (100 MG TOTAL) BY MOUTH AT BEDTIME AS NEEDED. Patient not taking: Reported on 05/04/2016 06/24/15   Hoyt Koch, MD  triamcinolone cream (KENALOG) 0.1 % Apply 1 application topically 2 (two) times daily. 03/28/19   [provider]  triamcinolone cream (KENALOG) 0.5 % Apply 1 application topically daily as needed for itching. 03/29/19   [provider]  Vitamin D3 (VITAMIN D) 25 MCG tablet Take 1,000 Units by mouth daily.    [provider]  vitamin E 400 UNIT capsule Take 400 Units by mouth daily.    [provider]  Zinc Gluconate 100 MG TABS Take 100 mg by mouth daily.    [provider]    Allergies    Penicillins  Review of Systems   Review of Systems  Unable to perform ROS: Dementia  Constitutional: Negative for fever.  level 5 caveat  - dementia    Physical Exam Updated Vital Signs There were no vitals taken for this visit.  Physical Exam Vitals and nursing note reviewed.  Constitutional:      Appearance: Normal appearance. She is well-developed.  HENT:     Head: Atraumatic.     Nose: Nose normal.     Mouth/Throat:     Mouth: Mucous membranes are moist.  Eyes:     General: No scleral icterus.    Conjunctiva/sclera: Conjunctivae normal.     Pupils: Pupils are equal, round, and reactive to light.  Neck:     Trachea: No tracheal deviation.  Cardiovascular:     Rate and Rhythm: Normal rate and regular rhythm.     Pulses: Normal pulses.     Heart sounds: Normal heart sounds. No murmur heard.  No friction rub. No gallop.   Pulmonary:     Effort:  Pulmonary effort is normal. No respiratory distress.     Breath sounds: Normal breath sounds.  Chest:     Chest wall: No tenderness.  Abdominal:     General: Bowel sounds are normal. There is no distension.     Palpations: Abdomen is soft.     Tenderness: There is no abdominal tenderness. There is no guarding.  Genitourinary:    Comments: No cva tenderness.  Musculoskeletal:        General: No swelling.     Cervical back: Normal range of motion and neck supple. No rigidity or tenderness. No muscular tenderness.     Comments: CTLS spine, non  tender, aligned, no step off. Pain w rom right knee and hip, otherwise good rom bil ext without pain or focal bony tenderness. Distal pulses palp bil.   Skin:    General: Skin is warm and dry.     Findings: No rash.  Neurological:     Mental Status: She is alert.     Comments: Alert, speech normal. Moves bil ext purposefully with good strength.   Psychiatric:        Mood and Affect: Mood normal.     ED Results / Procedures / Treatments   Labs (all labs ordered are listed, but only abnormal results are displayed) Labs Reviewed - No data to display  EKG None  Radiology DG Knee Complete 4 Views Right  Result Date: 08/12/2019 CLINICAL DATA:  Pain secondary to a fall. EXAM: RIGHT KNEE - COMPLETE 4+ VIEW COMPARISON:  None. FINDINGS: No acute fracture or dislocation or joint effusion. Diffuse osteopenia. Old healed fracture of the distal right femoral shaft. Surgical screws in the lateral tibial plateau likely due to prior tibial plateau fracture. Arthritic changes and old posttraumatic changes of the lateral compartment. IMPRESSION: No acute abnormality. Old healed fractures of the distal right femoral shaft and lateral tibial plateau. Electronically Signed   By: Francene Boyers M.D.   On: 08/12/2019 14:44   DG HIP UNILAT W OR W/O PELVIS 2-3 VIEWS RIGHT  Result Date: 08/12/2019 CLINICAL DATA:  Right hip pain secondary to a fall. EXAM: DG HIP  (WITH OR WITHOUT PELVIS) 2-3V RIGHT COMPARISON:  None. FINDINGS: Patient has severe arthritis of the right hip with protrusio deformity. Marked joint space narrowing with subcortical cysts and erosions at the right hip. No visible fracture. Scarring in the right femoral shaft consistent with previous intramedullary nail. Diffuse osteopenia. 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 No visible fracture. Electronically Signed   By: Francene Boyers M.D.   On: 08/12/2019 14:56    Procedures Procedures (including critical care time)  Medications Ordered in ED Medications - No data to display  ED Course  I have reviewed the triage vital signs and the nursing notes.  Pertinent labs & imaging results that were available during my care of the patient were reviewed by me and considered in my medical decision making (see chart for details).    MDM Rules/Calculators/A&P                          Imaging studies ordered.   Reviewed nursing notes and prior charts for additional history.   Xrays reviewed/interpreted by me - no acute fracture. Degenerative changes noted.   Acetaminophen po.   Recheck pt - comfortable, no distress or apparent pain/discomfort.  Pt appears stable for d/c.    Final Clinical Impression(s) / ED Diagnoses Final diagnoses:  None    Rx / DC Orders ED Discharge Orders    None       Cathren Laine, MD 08/12/19 1626

## 2019-08-12 NOTE — ED Notes (Signed)
[facility] call for patient transport back to the nursing home.

## 2019-08-12 NOTE — Discharge Instructions (Signed)
It was our pleasure to provide your ER care today - we hope that you feel better.  Fall precautions.   Take acetaminophen as need.  Follow up with primary care doctor in 1 week if symptoms fail to improve/resolve.  Return to ER if worse, new symptoms, fevers, trouble breathing, new or severe pain, or other concern.

## 2020-08-28 ENCOUNTER — Emergency Department (HOSPITAL_COMMUNITY): Payer: Medicare PPO

## 2020-08-28 ENCOUNTER — Emergency Department (HOSPITAL_COMMUNITY)
Admission: EM | Admit: 2020-08-28 | Discharge: 2020-08-29 | Disposition: A | Discharge status: Skilled Nursing Facility | Payer: Medicare PPO | Attending: Emergency Medicine | Admitting: Emergency Medicine

## 2020-08-28 ENCOUNTER — Encounter (HOSPITAL_COMMUNITY): Payer: Self-pay | Admitting: Oncology

## 2020-08-28 ENCOUNTER — Other Ambulatory Visit: Payer: Self-pay

## 2020-08-28 DIAGNOSIS — I10 Essential (primary) hypertension: Secondary | ICD-10-CM | POA: Insufficient documentation

## 2020-08-28 DIAGNOSIS — W1809XA Striking against other object with subsequent fall, initial encounter: Secondary | ICD-10-CM | POA: Diagnosis not present

## 2020-08-28 DIAGNOSIS — Y92129 Unspecified place in nursing home as the place of occurrence of the external cause: Secondary | ICD-10-CM | POA: Diagnosis not present

## 2020-08-28 DIAGNOSIS — Z79899 Other long term (current) drug therapy: Secondary | ICD-10-CM | POA: Insufficient documentation

## 2020-08-28 DIAGNOSIS — F0280 Dementia in other diseases classified elsewhere without behavioral disturbance: Secondary | ICD-10-CM | POA: Diagnosis not present

## 2020-08-28 DIAGNOSIS — Z7982 Long term (current) use of aspirin: Secondary | ICD-10-CM | POA: Diagnosis not present

## 2020-08-28 DIAGNOSIS — S0181XA Laceration without foreign body of other part of head, initial encounter: Secondary | ICD-10-CM | POA: Insufficient documentation

## 2020-08-28 DIAGNOSIS — G301 Alzheimer's disease with late onset: Secondary | ICD-10-CM | POA: Insufficient documentation

## 2020-08-28 DIAGNOSIS — Z87891 Personal history of nicotine dependence: Secondary | ICD-10-CM | POA: Diagnosis not present

## 2020-08-28 DIAGNOSIS — S0990XA Unspecified injury of head, initial encounter: Secondary | ICD-10-CM | POA: Diagnosis present

## 2020-08-28 NOTE — ED Provider Notes (Signed)
[facility]  HOSP[facility]-EMERGENCY DEPT Provider Note   CSN: [id number] Arrival date & time: 08/28/20  1519     History Chief Complaint  Patient presents with   Marletta Lor    Elizabeth Serrano is a 87 y.o. female.  Patient with fall at nursing home.  Hit her head.  Has skin tear to her right forehead.  Not on blood thinners.  Appears to be at her baseline per EMS per facility.  Patient denies any extremity pain, headache, neck pain.  The history is provided by the patient.  Fall This is a new problem. The current episode started less than 1 hour ago. The problem has been resolved. Pertinent negatives include no chest pain, no abdominal pain, no headaches and no shortness of breath. Nothing aggravates the symptoms. Nothing relieves the symptoms. She has tried nothing for the symptoms. The treatment provided no relief.      Past Medical History:  Diagnosis Date   Dementia (HCC)    GERD (gastroesophageal reflux disease)    Hypertension    LVH (left ventricular hypertrophy)     Patient Active Problem List   Diagnosis Date Noted   Encounter for examination for admission to nursing home 05/31/2015   Urinary incontinence in female 05/12/2015   Dementia of the Alzheimer's type, with late onset, with delusions (HCC) 02/07/2015   Essential hypertension 02/07/2015   GERD (gastroesophageal reflux disease) 02/07/2015    History reviewed. No pertinent surgical history.   OB History   No obstetric history on file.     No family history on file.  Social History   Tobacco Use   Smoking status: Former    Pack years: 0.00    Types: Cigarettes    Quit date: 02/23/1977    Years since quitting: 43.5  Substance Use Topics   Alcohol use: Not Currently   Drug use: Not Currently    Home Medications Prior to Admission medications   Medication Sig Start Date End Date Taking? Authorizing Provider  acetaminophen (TYLENOL) 500 MG tablet Take 500 mg by mouth every 6 (six) hours as needed  for fever.     [provider]  Alum & Mag Hydroxide-Simeth (ALMACONE PO) Take 30 mLs by mouth as needed (indigestion).     [provider]  aspirin 81 MG tablet Take 81 mg by mouth daily.    [provider]  augmented betamethasone dipropionate (DIPROLENE-AF) 0.05 % cream Apply 1 application topically daily. Apply topically to affected area rash after bathing once daily (avoid on face) 11/17/18   [provider]  brimonidine (ALPHAGAN P) 0.1 % SOLN Place 2 drops into both eyes daily.     [provider]  donepezil (ARICEPT) 10 MG tablet TAKE 1 TABLET BY MOUTH EVERY NIGHT AT BEDTIME Patient taking differently: Take 10 mg by mouth at bedtime.  06/25/15   Corwin Levins, MD  dorzolamide-timolol (COSOPT) 22.3-6.8 MG/ML ophthalmic solution Place 1 drop into both eyes 2 (two) times daily.     [provider]  guaifenesin (ROBITUSSIN) 100 MG/5ML syrup Take 200 mg by mouth every 6 (six) hours as needed for cough.     [provider]  hydrOXYzine (ATARAX/VISTARIL) 25 MG tablet Take 1 tablet (25 mg total) by mouth every 4 (four) hours as needed for itching. 05/04/16   Lyndal Pulley, MD  lansoprazole (PREVACID) 15 MG capsule Take 30 mg by mouth daily.     [provider]  loperamide (IMODIUM A-D) 2 MG tablet Take  2 mg by mouth as needed for diarrhea or loose stools. Do not exceed 8 doses in 24 hours    [provider]  LORazepam (ATIVAN) 1 MG tablet TAKE 1 TABLET BY MOUTH TWICE DAILY AS NEEDED FOR HALLUCINATION Patient not taking: Reported on 04/01/2019 08/16/15   Myrlene Broker, MD  LUMIGAN 0.01 % SOLN INSTILL ONE DROP INTO BOTH EYES EVERY NIGHT AT BEDTIME Patient taking differently: Place 1 drop into both eyes at bedtime.  06/11/15   Corwin Levins, MD  magnesium hydroxide (MILK OF MAGNESIA) 400 MG/5ML suspension Take 30 mLs by mouth at bedtime as needed for mild constipation.    [provider]   neomycin-bacitracin-polymyxin (NEOSPORIN) [phone] ointment Apply 1 application topically as needed (wounds). Clean area with NS, apply neosporin or equivalent, cover with band aid or guaze and tape, change as needed until healed.     [provider]  olmesartan-hydrochlorothiazide (BENICAR HCT) 20-12.5 MG tablet TAKE 1 TABLET BY MOUTH DAILY Patient not taking: Reported on 04/01/2019 06/25/15   Corwin Levins, MD  QUEtiapine (SEROQUEL) 100 MG tablet TAKE 1 TABLET (100 MG TOTAL) BY MOUTH AT BEDTIME AS NEEDED. Patient not taking: Reported on 05/04/2016 06/24/15   Myrlene Broker, MD  triamcinolone cream (KENALOG) 0.1 % Apply 1 application topically 2 (two) times daily. 03/28/19   [provider]  triamcinolone cream (KENALOG) 0.5 % Apply 1 application topically daily as needed for itching. 03/29/19   [provider]  Vitamin D3 (VITAMIN D) 25 MCG tablet Take 1,000 Units by mouth daily.    [provider]  vitamin E 400 UNIT capsule Take 400 Units by mouth daily.    [provider]  Zinc Gluconate 100 MG TABS Take 100 mg by mouth daily.    [provider]    Allergies    Penicillins  Review of Systems   Review of Systems  Constitutional:  Negative for chills and fever.  HENT:  Negative for ear pain and sore throat.   Eyes:  Negative for pain and visual disturbance.  Respiratory:  Negative for cough and shortness of breath.   Cardiovascular:  Negative for chest pain and palpitations.  Gastrointestinal:  Negative for abdominal pain and vomiting.  Genitourinary:  Negative for dysuria and hematuria.  Musculoskeletal:  Negative for arthralgias and back pain.  Skin:  Negative for color change and rash.  Neurological:  Negative for seizures, syncope and headaches.  All other systems reviewed and are negative.  Physical Exam Updated Vital Signs BP 136/72 (BP Location: Left Arm)   Pulse 68   Temp 97.6 F (36.4 C) (Oral)   Resp 14   SpO2 95%    Physical Exam Vitals and nursing note reviewed.  Constitutional:      General: She is not in acute distress.    Appearance: She is well-developed. She is not ill-appearing.  HENT:     Mouth/Throat:     Mouth: Mucous membranes are moist.  Eyes:     Extraocular Movements: Extraocular movements intact.     Conjunctiva/sclera: Conjunctivae normal.     Pupils: Pupils are equal, round, and reactive to light.  Cardiovascular:     Rate and Rhythm: Normal rate and regular rhythm.     Pulses: Normal pulses.     Heart sounds: Normal heart sounds. No murmur heard. Pulmonary:     Effort: Pulmonary effort is normal. No respiratory distress.     Breath sounds: Normal breath sounds.  Abdominal:  Palpations: Abdomen is soft.     Tenderness: There is no abdominal tenderness.  Musculoskeletal:        General: No tenderness or deformity. Normal range of motion.     Cervical back: Normal range of motion and neck supple. No tenderness.  Skin:    General: Skin is warm and dry.     Capillary Refill: Capillary refill takes less than 2 seconds.     Comments: Skin tear to right side of forehead that is hemostatic  Neurological:     General: No focal deficit present.     Mental Status: She is alert.    ED Results / Procedures / Treatments   Labs (all labs ordered are listed, but only abnormal results are displayed) Labs Reviewed - No data to display  EKG None  Radiology CT Head Wo Contrast  Result Date: 08/28/2020 CLINICAL DATA:  87 year old post fall. Laceration to forehead. Contusion above right eye. EXAM: CT HEAD WITHOUT CONTRAST TECHNIQUE: Contiguous axial images were obtained from the base of the skull through the vertex without intravenous contrast. COMPARISON:  Head CT 04/01/2019 FINDINGS: Brain: Stable degree of atrophy and chronic small vessel ischemia. No intracranial hemorrhage, mass effect, or midline shift. No hydrocephalus. The basilar cisterns are patent. No evidence of  territorial infarct or acute ischemia. No extra-axial or intracranial fluid collection. Vascular: Atherosclerosis of skullbase vasculature without hyperdense vessel or abnormal calcification. Skull: No fracture or focal lesion. Sinuses/Orbits: No acute findings. No visualized fracture. Bilateral cataract resection Other: Small right frontal scalp hematoma. IMPRESSION: 1. Small right frontal scalp hematoma. No acute intracranial abnormality. No skull fracture. 2. Stable atrophy and chronic small vessel ischemia. Electronically Signed   By: Narda Rutherford M.D.   On: 08/28/2020 17:10   CT Cervical Spine Wo Contrast  Result Date: 08/28/2020 CLINICAL DATA:  Fall this afternoon. EXAM: CT CERVICAL SPINE WITHOUT CONTRAST TECHNIQUE: Multidetector CT imaging of the cervical spine was performed without intravenous contrast. Multiplanar CT image reconstructions were also generated. COMPARISON:  CT cervical spine 04/01/2019 FINDINGS: Patient had difficulty tolerating the exam, there is motion artifact despite repeat acquisition. Alignment: Straightening of normal lordosis. No traumatic subluxation. Skull base and vertebrae: No acute fracture. Dens and skull base are intact. Degenerative flattening of C4 on C5, similar to prior. Soft tissues and spinal canal: No prevertebral fluid or swelling. No visible canal hematoma. Disc levels: Diffuse degenerative disc disease most prominently affecting C4-C5 and C5-C6. Multilevel facet hypertrophy. Similar degenerative changes to prior. Upper chest: No acute findings. Other: Carotid calcifications IMPRESSION: Multilevel degenerative change throughout the cervical spine without acute fracture or subluxation. Electronically Signed   By: Narda Rutherford M.D.   On: 08/28/2020 17:13    Procedures .Marland KitchenLaceration Repair  Date/Time: 08/28/2020 4:36 PM Performed by: Virgina Norfolk, DO Authorized by: Virgina Norfolk, DO   Consent:    Consent obtained:  Verbal and emergent situation    Risks discussed:  Need for additional repair, infection, nerve damage, pain, poor wound healing, poor cosmetic result, retained foreign body, tendon damage and vascular damage   Alternatives discussed:  No treatment Universal protocol:    Procedure explained and questions answered to patient or proxy's satisfaction: yes     Relevant documents present and verified: yes     Patient identity confirmed:  Arm band Anesthesia:    Anesthesia method:  None Laceration details:    Location:  Face   Face location:  Forehead   Length (cm):  2   Depth (mm):  1 Pre-procedure details:    Preparation:  Patient was prepped and draped in usual sterile fashion Exploration:    Wound extent: no areolar tissue violation noted, no fascia violation noted, no foreign bodies/material noted, no muscle damage noted, no nerve damage noted, no tendon damage noted, no underlying fracture noted and no vascular damage noted     Contaminated: no   Treatment:    Area cleansed with:  Saline   Amount of cleaning:  Standard   Irrigation solution:  Sterile water   Irrigation method:  Pressure wash   Visualized foreign bodies/material removed: no     Debridement:  None   Undermining:  None Skin repair:    Repair method:  Steri-Strips   Number of Steri-Strips:  3 Approximation:    Approximation:  Close Repair type:    Repair type:  Simple Post-procedure details:    Dressing:  Open (no dressing)   Medications Ordered in ED Medications - No data to display  ED Course  I have reviewed the triage vital signs and the nursing notes.  Pertinent labs & imaging results that were available during my care of the patient were reviewed by me and considered in my medical decision making (see chart for details).    MDM Rules/Calculators/A&P                          Elizabeth Serrano is here after fall at facility.  Patient with normal vitals.  No fever.  At her baseline per EMS.  Has dementia.  Not on blood thinners.  Has skin  tear/laceration to the right side of the forehead that is hemostatic.  It was repaired with Steri-Strips.  She has no extremity tenderness.  She is very pleasant.  Reports no pain.  CT scan of the head and neck were negative for any acute injuries or findings.  Will discharge back to facility.  This chart was dictated using voice recognition software.  Despite best efforts to proofread,  errors can occur which can change the documentation meaning.   Final Clinical Impression(s) / ED Diagnoses Final diagnoses:  Laceration of forehead, initial encounter    Rx / DC Orders ED Discharge Orders     None        Virgina NorfolkCuratolo, Kurstin Dimarzo, DO 08/28/20 1718

## 2020-08-28 NOTE — Discharge Instructions (Addendum)
CT scan of head and neck are normal.  Leave Steri-Strips in place for the next several days.

## 2020-08-28 NOTE — ED Triage Notes (Signed)
Pt bib [facility] from [facility] s/p fall this afternoon.  Pt hit her head.  Per EMS, superficial laceration to forehead as well as a contusion above right eye noted.  Pt is confused at baseline.  Dressing to laceration in place.

## 2020-08-28 NOTE — ED Notes (Signed)
[facility] notified of need for transport, dispatch reports 19 calls ahead of pt.

## 2020-08-28 NOTE — ED Notes (Signed)
Pt provided dinner tray.

## 2021-08-09 IMAGING — CT CT CERVICAL SPINE W/O CM
4 of 8 series · 12 of 33 positions shown, 13 images · non-contrast
Comparison: CT cervical spine 04/01/2019

CLINICAL DATA: Fall this afternoon.

EXAM:
CT CERVICAL SPINE WITHOUT CONTRAST
TECHNIQUE: Multidetector CT imaging of the cervical spine was performed without
intravenous contrast. Multiplanar CT image reconstructions were also
generated.

[Series 3: c spine soft · axial · 0.31mm/px · z∈[-180,-126]mm · 2 of 81 slices shown]
[im 27/81  soft-tissue]
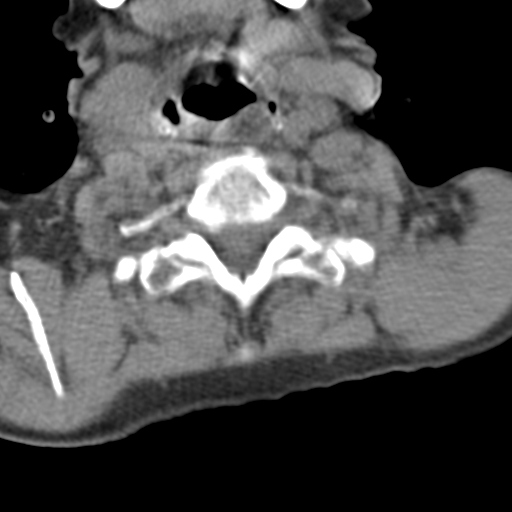
[im 54/81  soft-tissue]
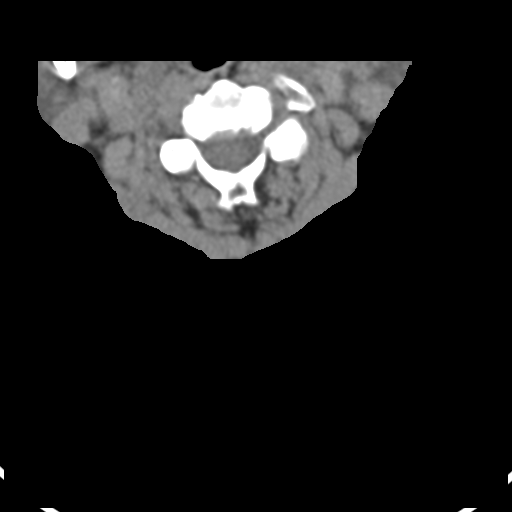

[Series 6: orthogonal bone · axial · 0.23mm/px · z∈[-207,-159]mm · 2 of 78 slices shown, 3 images]
[im 26/78  soft-tissue]
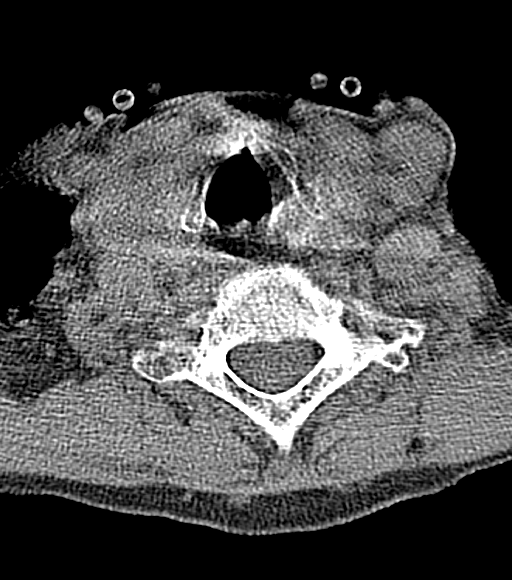
[im 26/78  bone]
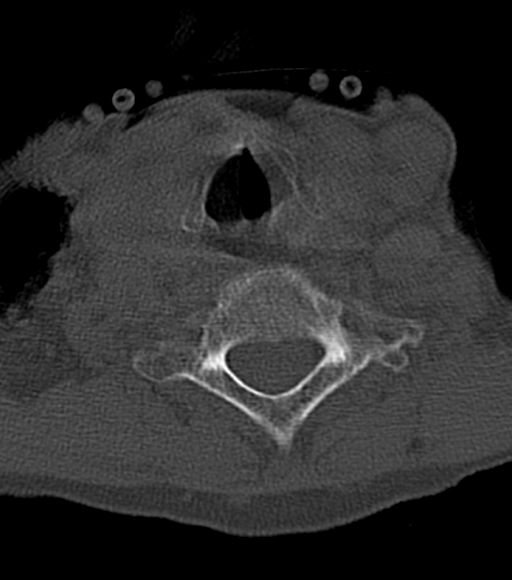
[im 52/78  bone]
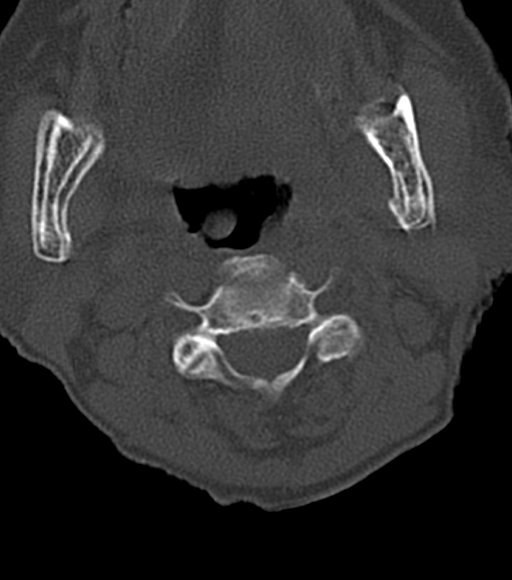

[Series 7: coronal bone · coronal · 0.23mm/px · 3 of 65 slices shown]
[im 15/65  bone]
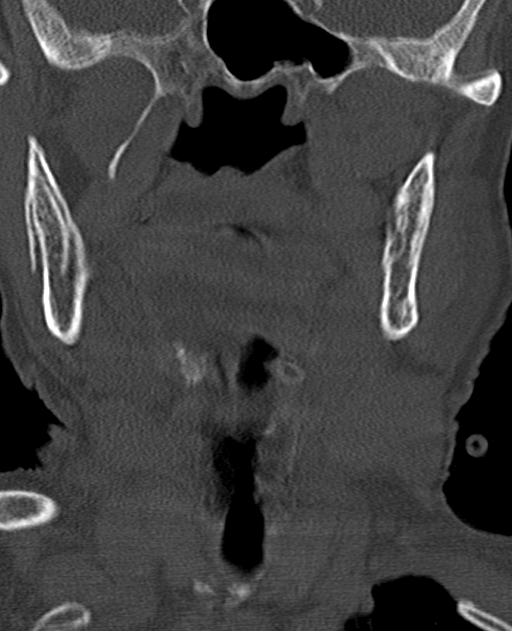
[im 29/65  bone]
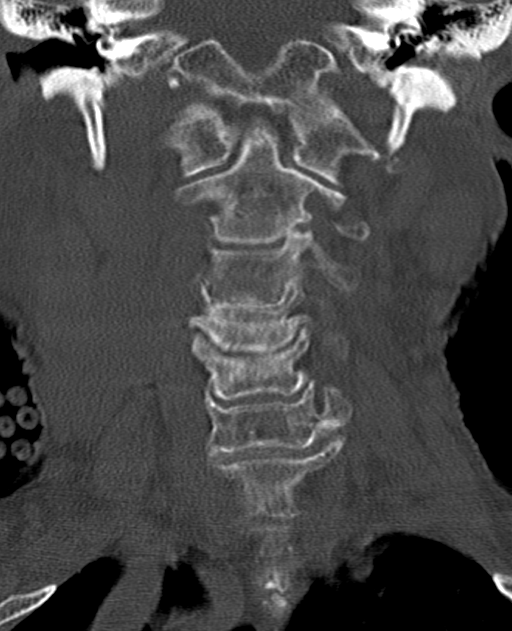
[im 44/65  bone]
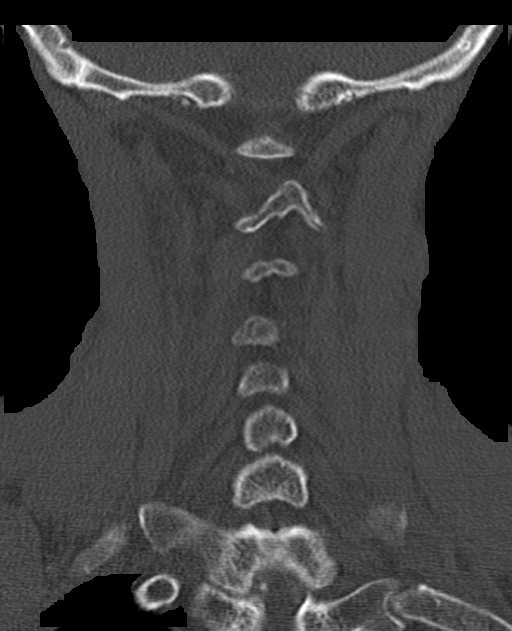

[Series 8: sagittal bone · sagittal · 0.26mm/px · 5 of 61 slices shown]
[im 11/61  bone]
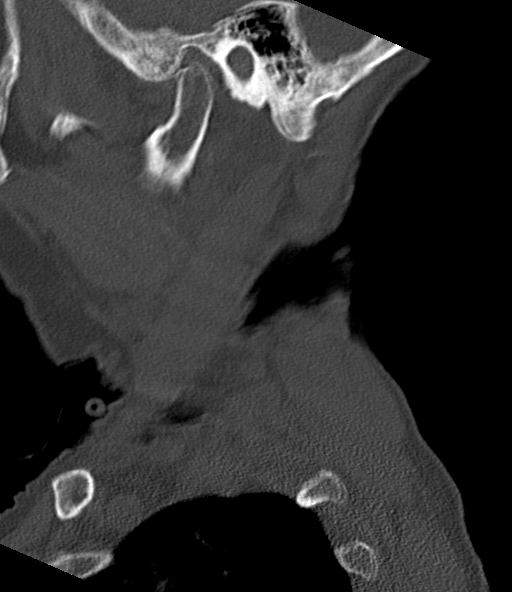
[im 21/61  bone]
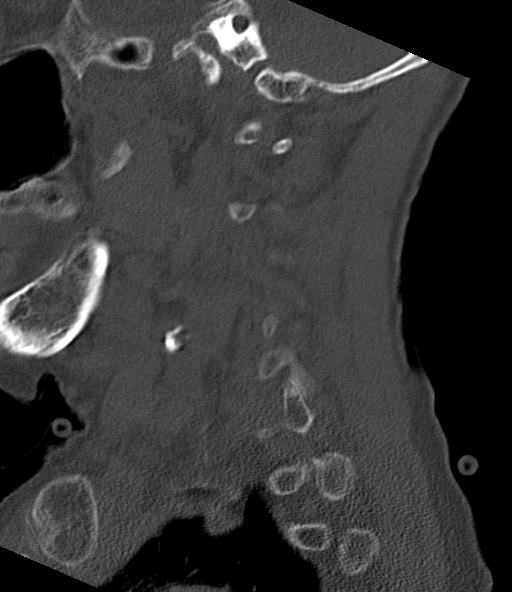
[im 31/61  bone]
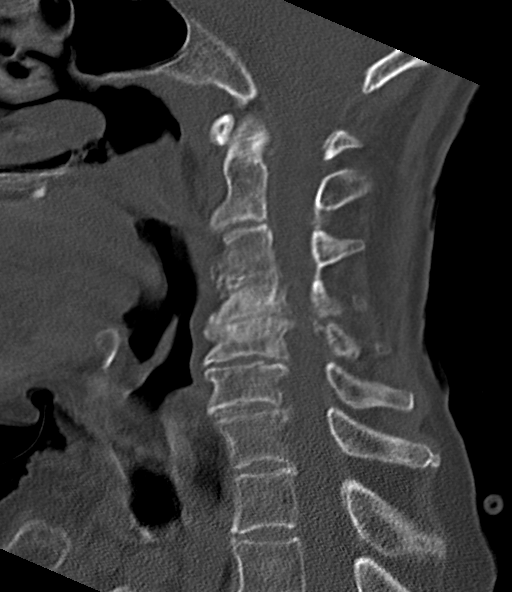
[im 41/61  bone]
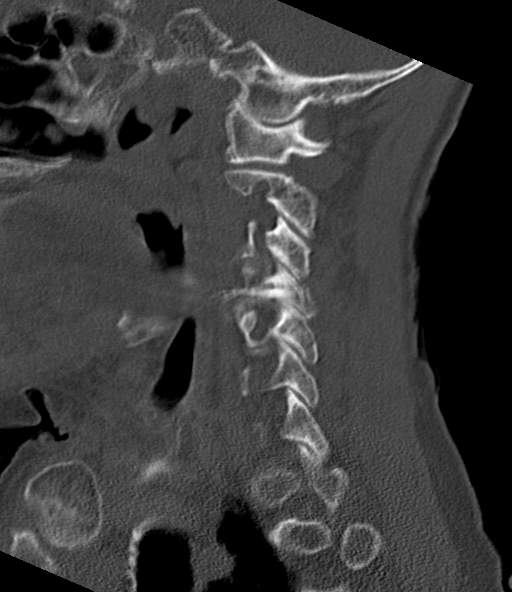
[im 51/61  bone]
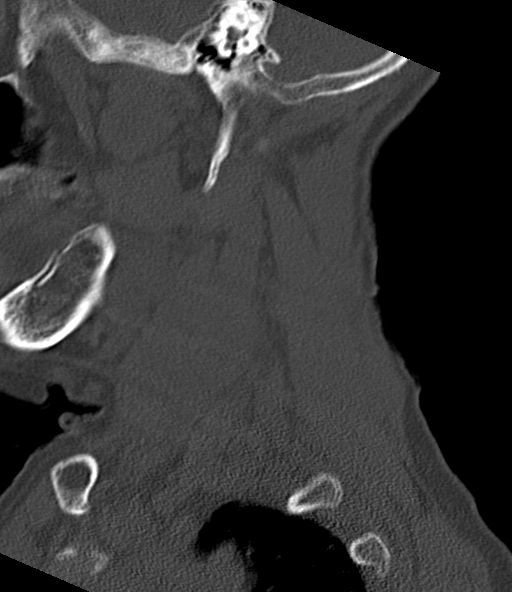

[12 of 33 positions shown; findings below may reference images not displayed]

FINDINGS: Patient had difficulty tolerating the exam, there is motion artifact
despite repeat acquisition.

Alignment: Straightening of normal lordosis. No traumatic
subluxation.

Skull base and vertebrae: No acute fracture. Dens and skull base are
intact. Degenerative flattening of C4 on C5, similar to prior.

Soft tissues and spinal canal: No prevertebral fluid or swelling. No
visible canal hematoma.

Disc levels: Diffuse degenerative disc disease most prominently
affecting C4-C5 and C5-C6. Multilevel facet hypertrophy. Similar
degenerative changes to prior.

Upper chest: No acute findings.

Other: Carotid calcifications
IMPRESSION: Multilevel degenerative change throughout the cervical spine without
acute fracture or subluxation.

## 2022-10-25 DEATH — deceased
